# Patient Record
Sex: Male | Born: 1975 | Race: Asian | Hispanic: No | Marital: Married | Smoking: Former smoker
Health system: Southern US, Community
[De-identification: ages and names within clinical notes are randomized; demographics above are authoritative.]

## PROBLEM LIST (undated history)

## (undated) DIAGNOSIS — E782 Mixed hyperlipidemia: Secondary | ICD-10-CM

## (undated) DIAGNOSIS — M1A09X0 Idiopathic chronic gout, multiple sites, without tophus (tophi): Secondary | ICD-10-CM

## (undated) DIAGNOSIS — F17200 Nicotine dependence, unspecified, uncomplicated: Secondary | ICD-10-CM

## (undated) DIAGNOSIS — E781 Pure hyperglyceridemia: Secondary | ICD-10-CM

## (undated) DIAGNOSIS — R0789 Other chest pain: Secondary | ICD-10-CM

## (undated) DIAGNOSIS — N200 Calculus of kidney: Secondary | ICD-10-CM

## (undated) DIAGNOSIS — L309 Dermatitis, unspecified: Secondary | ICD-10-CM

## (undated) DIAGNOSIS — Q809 Congenital ichthyosis, unspecified: Secondary | ICD-10-CM

## (undated) DIAGNOSIS — G8929 Other chronic pain: Secondary | ICD-10-CM

## (undated) DIAGNOSIS — M722 Plantar fascial fibromatosis: Secondary | ICD-10-CM

## (undated) DIAGNOSIS — M25562 Pain in left knee: Secondary | ICD-10-CM

## (undated) HISTORY — DX: Calculus of kidney: N20.0

## (undated) HISTORY — DX: Mixed hyperlipidemia: E78.2

## (undated) HISTORY — DX: Dermatitis, unspecified: L30.9

## (undated) HISTORY — DX: Plantar fascial fibromatosis: M72.2

## (undated) HISTORY — DX: Nicotine dependence, unspecified, uncomplicated: F17.200

## (undated) HISTORY — DX: Congenital ichthyosis, unspecified: Q80.9

## (undated) HISTORY — DX: Idiopathic chronic gout, multiple sites, without tophus (tophi): M1A.09X0

## (undated) HISTORY — DX: Other chronic pain: G89.29

## (undated) HISTORY — DX: Other chest pain: R07.89

## (undated) HISTORY — DX: Pain in left knee: M25.562

## (undated) HISTORY — DX: Pure hyperglyceridemia: E78.1

---

## 2016-10-18 DIAGNOSIS — J301 Allergic rhinitis due to pollen: Secondary | ICD-10-CM | POA: Insufficient documentation

## 2016-10-18 LAB — HEPATIC FUNCTION PANEL
ALK PHOS: 79 (ref 25–125)
ALT: 31 (ref 10–40)
AST: 20 (ref 14–40)
Bilirubin, Total: 0.6

## 2016-10-18 LAB — LIPID PANEL
CHOLESTEROL: 223 — AB (ref 0–200)
HDL: 45 (ref 35–70)
LDL Cholesterol: 118
TRIGLYCERIDES: 302 — AB (ref 40–160)

## 2016-10-18 LAB — BASIC METABOLIC PANEL
BUN: 16 (ref 4–21)
Creatinine: 1 (ref 0.6–1.3)
Glucose: 102
POTASSIUM: 4.4 (ref 3.4–5.3)
Sodium: 142 (ref 137–147)

## 2016-10-18 LAB — CBC AND DIFFERENTIAL
HEMATOCRIT: 44 (ref 41–53)
HEMOGLOBIN: 15.4 (ref 13.5–17.5)
Platelets: 231 (ref 150–399)
WBC: 7.7

## 2017-11-14 ENCOUNTER — Ambulatory Visit: Payer: BC Managed Care – PPO | Admitting: Family Medicine

## 2017-11-14 ENCOUNTER — Encounter: Payer: Self-pay | Admitting: *Deleted

## 2017-11-14 ENCOUNTER — Other Ambulatory Visit: Payer: Self-pay

## 2017-11-14 ENCOUNTER — Telehealth: Payer: Self-pay | Admitting: *Deleted

## 2017-11-14 VITALS — BP 113/70 | HR 64 | Temp 98.3°F | Resp 16 | Ht 69.0 in | Wt 188.8 lb

## 2017-11-14 DIAGNOSIS — L2084 Intrinsic (allergic) eczema: Secondary | ICD-10-CM | POA: Diagnosis not present

## 2017-11-14 DIAGNOSIS — Q809 Congenital ichthyosis, unspecified: Secondary | ICD-10-CM

## 2017-11-14 MED ORDER — TRIAMCINOLONE ACETONIDE 0.5 % EX OINT: 1.0000 "application " | TOPICAL_OINTMENT | Freq: Two times a day (BID) | CUTANEOUS | 2 refills | Status: DC

## 2017-11-14 NOTE — Progress Notes (Signed)
Office Note 11/14/2017  CC:  Chief Complaint  Patient presents with  . Establish Care  . Eczema    HPI:  Gene Burke is a 41 y.o. Asian male who is here to establish care Patient's most recent primary MD: dr. Lendon ColonelHawks.  [facility]. Isidore Moosffice reviewed prior to or during today's visit. This consists of pt's last CPE, dated 10/13/15--it does not include any lab results (CBC, CMET, FLP and UA were ordered at that time).  Has eczematous dermatitis primarily on hands: sides and tops of fingers get tiny, itchy blisters.  In the past he has applied topical steroids and these were helpful, although it is unclear why he stopped applying this medication--possibly some loss of efficacy over time. Has been applying small patches from [place] to the areas.  He says the have a med soaked into them, sounds like a topical steroid.  Past Medical History:  Diagnosis Date  . 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 Chronic pain of left knee   . Dermatitis   . Hypertriglyceridemia   . Tobacco dependence     History reviewed. No pertinent surgical history.  Family History  Problem Relation Age of Onset  . Alzheimer's disease Father   . Parkinson's disease Father   . Heart attack Maternal Grandmother   . Lung disease Maternal Grandfather   . Stroke Paternal Grandfather [phone]  . Cancer Neg Hx   . Diabetes Neg Hx     Social History   Socioeconomic History  . Marital status: Married    Spouse name: Not on file  . Number of children: Not on file  . Years of education: Not on file  . Highest education level: Not on file  Social Needs  . Financial resource strain: Not on file  . Food insecurity - worry: Not on file  . Food insecurity - inability: Not on file  . Transportation needs - medical: Not on file  . Transportation needs - non-medical: Not on file  Occupational History  . Not on file  Tobacco Use  . Smoking status: Current Every Day  Smoker    Packs/day: 0.25    Years: 15.00    Pack years: 3.75    Types: Cigarettes  . Smokeless tobacco: Never Used  . Tobacco comment: has bought patches  Substance and Sexual Activity  . Alcohol use: No    Frequency: Never  . Drug use: No  . Sexual activity: Not on file  Other Topics Concern  . Not on file  Social History Narrative   Married, 1 son and 1 daughter.   Orig from [place].  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   Tob: 1 pack q 3 d: approx 10 pack-yr hx.     No alc.    Outpatient Encounter Medications as of 11/14/2017  Medication Sig  . fluticasone (FLONASE) 50 MCG/ACT nasal spray Place 1 spray into the nose daily.  Marland Kitchen. triamcinolone ointment (KENALOG) 0.5 % Apply 1 application topically 2 (two) times daily.   No facility-administered encounter medications on file as of 11/14/2017.     No Known Allergies  ROS Review of Systems  Constitutional: Negative for fatigue and fever.  HENT: Negative for congestion and sore throat.   Eyes: Negative for visual disturbance.  Respiratory: Negative for cough.   Cardiovascular: Negative for chest pain.  Gastrointestinal: Negative for abdominal pain and  nausea.  Genitourinary: Negative for dysuria.  Musculoskeletal: Negative for back pain and joint swelling.  Skin: Positive for rash (see hpi).  Neurological: Negative for weakness and headaches.  Hematological: Negative for adenopathy.    PE; Blood pressure 113/70, pulse 64, temperature 98.3 F (36.8 C), temperature source Oral, resp. rate 16, height 5\' 9"  (1.753 m), weight 188 lb 12 oz (85.6 kg), SpO2 95 %. Gen: Alert, well appearing.  Patient is oriented to person, place, time, and situation. AFFECT: pleasant, lucid thought and speech. NWG:NFAOENT:Eyes: no injection, icteris, swelling, or exudate.  EOMI, PERRLA. Mouth: lips without lesion/swelling.  Oral mucosa pink and moist. Oropharynx without erythema, exudate, or swelling.   CV: RRR, no m/r/g.   LUNGS: CTA bilat, nonlabored resps, good aeration in all lung fields. ABd: soft, NT/ND EXT: no clubbing, cyanosis, or edema.  SKIN: mild ichthyosis pattern to skin of both LL's.  Lateral aspect of left lower leg with a few small pinkish plaques/papules.  Similar lesions: small patches on fingers of R hand.  Pertinent labs:  None today  ASSESSMENT AND PLAN:   New pt: prior PCP record reviewed today.  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 Kenalog 0.5% generic ointment, apply bid to affected areas prn.  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  This is associated with atopy (see #1 above).  An After Visit Summary was printed and given to the patient.  Return for f/u at pt's conveneience for fasting CPE.  Signed:  Santiago BumpersPhil McGowen, MD           11/14/2017

## 2017-11-14 NOTE — Telephone Encounter (Signed)
[facility] Dr[facility] Lendon ColonelHawks.  I reviewed records and abstracted information into pts chart.   Records have been placed on Dr. Samul DadaMcGowen's desk for review.

## 2017-11-15 ENCOUNTER — Telehealth: Payer: Self-pay | Admitting: *Deleted

## 2017-11-15 DIAGNOSIS — Z0000 Encounter for general adult medical examination without abnormal findings: Secondary | ICD-10-CM

## 2017-11-15 DIAGNOSIS — M109 Gout, unspecified: Secondary | ICD-10-CM

## 2017-11-15 NOTE — Telephone Encounter (Signed)
Pt coming in 12/01/17 for labs for his CPE on 12/02/17.   HM labs ordered. Please let me know if any other labs are needed.

## 2017-11-15 NOTE — Telephone Encounter (Signed)
Also do a uric acid, dx code is gout-unspecified.-thx

## 2017-11-16 NOTE — Telephone Encounter (Signed)
HM labs and Uric acid lab order as directed.

## 2017-12-01 ENCOUNTER — Other Ambulatory Visit (INDEPENDENT_AMBULATORY_CARE_PROVIDER_SITE_OTHER): Payer: BC Managed Care – PPO

## 2017-12-01 DIAGNOSIS — Z0000 Encounter for general adult medical examination without abnormal findings: Secondary | ICD-10-CM

## 2017-12-01 DIAGNOSIS — M109 Gout, unspecified: Secondary | ICD-10-CM | POA: Diagnosis not present

## 2017-12-01 LAB — COMPREHENSIVE METABOLIC PANEL
ALK PHOS: 64 U/L (ref 39–117)
ALT: 53 U/L (ref 0–53)
AST: 35 U/L (ref 0–37)
Albumin: 4.6 g/dL (ref 3.5–5.2)
BUN: 15 mg/dL (ref 6–23)
CO2: 29 mEq/L (ref 19–32)
Calcium: 9.3 mg/dL (ref 8.4–10.5)
Chloride: 105 mEq/L (ref 96–112)
Creatinine, Ser: 0.91 mg/dL (ref 0.40–1.50)
GFR: 97.49 mL/min (ref >60.00)
GLUCOSE: 88 mg/dL (ref 70–99)
POTASSIUM: 4.3 meq/L (ref 3.5–5.1)
Sodium: 142 mEq/L (ref 135–145)
TOTAL PROTEIN: 7.3 g/dL (ref 6.0–8.3)
Total Bilirubin: 0.8 mg/dL (ref 0.2–1.2)

## 2017-12-01 LAB — CBC WITH DIFFERENTIAL/PLATELET
Basophils Absolute: 0.1 10*3/uL (ref 0.0–0.1)
Basophils Relative: 0.9 % (ref 0.0–3.0)
EOS PCT: 4.3 % (ref 0.0–5.0)
Eosinophils Absolute: 0.3 10*3/uL (ref 0.0–0.7)
HCT: 44.7 % (ref 39.0–52.0)
HEMOGLOBIN: 15.1 g/dL (ref 13.0–17.0)
LYMPHS ABS: 2.2 10*3/uL (ref 0.7–4.0)
Lymphocytes Relative: 36.8 % (ref 12.0–46.0)
MCHC: 33.8 g/dL (ref 30.0–36.0)
MCV: 91 fl (ref 78.0–100.0)
MONOS PCT: 8.9 % (ref 3.0–12.0)
Monocytes Absolute: 0.5 10*3/uL (ref 0.1–1.0)
Neutro Abs: 3 10*3/uL (ref 1.4–7.7)
Neutrophils Relative %: 49.1 % (ref 43.0–77.0)
Platelets: 196 10*3/uL (ref 150.0–400.0)
RBC: 4.91 Mil/uL (ref 4.22–5.81)
RDW: 12.7 % (ref 11.5–15.5)
WBC: 6 10*3/uL (ref 4.0–10.5)

## 2017-12-01 LAB — LIPID PANEL
Cholesterol: 208 mg/dL — ABNORMAL HIGH (ref 0–200)
HDL: 52.5 mg/dL (ref >39.00)
NONHDL: 155.17
TRIGLYCERIDES: 262 mg/dL — AB (ref 0.0–149.0)
Total CHOL/HDL Ratio: 4
VLDL: 52.4 mg/dL — AB (ref 0.0–40.0)

## 2017-12-01 LAB — URIC ACID: URIC ACID, SERUM: 8.2 mg/dL — AB (ref 4.0–7.8)

## 2017-12-01 LAB — LDL CHOLESTEROL, DIRECT: Direct LDL: 130 mg/dL

## 2017-12-01 LAB — TSH: TSH: 2.59 u[IU]/mL (ref 0.35–4.50)

## 2017-12-02 ENCOUNTER — Ambulatory Visit (INDEPENDENT_AMBULATORY_CARE_PROVIDER_SITE_OTHER): Payer: BC Managed Care – PPO | Admitting: Family Medicine

## 2017-12-02 ENCOUNTER — Other Ambulatory Visit: Payer: Self-pay

## 2017-12-02 ENCOUNTER — Encounter: Payer: Self-pay | Admitting: Family Medicine

## 2017-12-02 VITALS — BP 104/65 | HR 61 | Temp 97.5°F | Resp 16 | Ht 69.0 in | Wt 194.5 lb

## 2017-12-02 DIAGNOSIS — N481 Balanitis: Secondary | ICD-10-CM

## 2017-12-02 DIAGNOSIS — Z0001 Encounter for general adult medical examination with abnormal findings: Secondary | ICD-10-CM | POA: Diagnosis not present

## 2017-12-02 DIAGNOSIS — Z0000 Encounter for general adult medical examination without abnormal findings: Secondary | ICD-10-CM

## 2017-12-02 DIAGNOSIS — Z23 Encounter for immunization: Secondary | ICD-10-CM

## 2017-12-02 MED ORDER — KETOCONAZOLE 2 % EX CREA: 1.0000 "application " | TOPICAL_CREAM | Freq: Every day | CUTANEOUS | 1 refills | Status: DC

## 2017-12-02 NOTE — Patient Instructions (Signed)

## 2017-12-02 NOTE — Progress Notes (Signed)
Office Note 12/03/2017  CC:  Chief Complaint  Patient presents with  . Annual Exam    HPI:  Gene Burke is a 41 y.o. Asian male who is here for annual health maintenance exam.  Exercise: no regular exercise at this time. Diet: fairly healthy per his description.  Acute complaint: for about the last 1 mo he has felt intermittent burning-type discomfort on head of penis. He is uncircumcised.  Has no penile d/c, no hx of rash.  No dysuria.  No hx of STD.  Married, sexually active with wife only.    Past Medical History:  Diagnosis Date  . 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 Chronic pain of left knee   . Eczema    Hands primarily  . Hypertriglyceridemia   . 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  . Tobacco dependence     History reviewed. No pertinent surgical history.  Family History  Problem Relation Age of Onset  . Alzheimer's disease Father   . Parkinson's disease Father   . Heart attack Maternal Grandmother   . Lung disease Maternal Grandfather   . Stroke Paternal Grandfather [phone]  . Cancer Neg Hx   . Diabetes Neg Hx     Social History   Socioeconomic History  . Marital status: Married    Spouse name: Not on file  . Number of children: Not on file  . Years of education: Not on file  . Highest education level: Not on file  Social Needs  . Financial resource strain: Not on file  . Food insecurity - worry: Not on file  . Food insecurity - inability: Not on file  . Transportation needs - medical: Not on file  . Transportation needs - non-medical: Not on file  Occupational History  . Not on file  Tobacco Use  . Smoking status: Current Every Day Smoker    Packs/day: 0.25    Years: 15.00    Pack years: 3.75    Types: Cigarettes  . Smokeless tobacco: Never Used  . Tobacco comment: has bought patches  Substance and Sexual Activity  . Alcohol use: No    Frequency: Never  . Drug use: No  . Sexual  activity: Not on file  Other Topics Concern  . Not on file  Social History Narrative   Married, 1 son and 1 daughter.   Orig from [place].  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   Tob: 1 pack q 3 d: approx 10 pack-yr hx.     No alc.    Outpatient Medications Prior to Visit  Medication Sig Dispense Refill  . fluticasone (FLONASE) 50 MCG/ACT nasal spray Place 1 spray into the nose daily.    Marland Kitchen. triamcinolone ointment (KENALOG) 0.5 % Apply 1 application topically 2 (two) times daily. 15 g 2   No facility-administered medications prior to visit.     No Known Allergies  ROS Review of Systems  Constitutional: Negative for appetite change, chills, fatigue and fever.  HENT: Negative for congestion, dental problem, ear pain and sore throat.   Eyes: Negative for discharge, redness and visual disturbance.  Respiratory: Negative for cough, chest tightness, shortness of breath and wheezing.   Cardiovascular: Negative for chest pain, palpitations and leg swelling.  Gastrointestinal: Negative for abdominal pain, blood in stool, diarrhea, nausea and vomiting.  Genitourinary: Positive for penile pain (see hpi). Negative  for difficulty urinating, dysuria, flank pain, frequency, hematuria and urgency.  Musculoskeletal: Negative for arthralgias, back pain, joint swelling, myalgias and neck stiffness.  Skin: Negative for pallor and rash.  Neurological: Negative for dizziness, speech difficulty, weakness and headaches.  Hematological: Negative for adenopathy. Does not bruise/bleed easily.  Psychiatric/Behavioral: Negative for confusion and sleep disturbance. The patient is not nervous/anxious.     PE; Blood pressure 104/65, pulse 61, temperature (!) 97.5 F (36.4 C), temperature source Oral, resp. rate 16, height 5\' 9"  (1.753 m), weight 194 lb 8 oz (88.2 kg), SpO2 94 %. Gen: Alert, well appearing.  Patient is oriented to person, place, time, and  situation. AFFECT: pleasant, lucid thought and speech. ENT: Ears: EACs clear, normal epithelium.  TMs with good light reflex and landmarks bilaterally.  Eyes: no injection, icteris, swelling, or exudate.  EOMI, PERRLA. Nose: no drainage or turbinate edema/swelling.  No injection or focal lesion.  Mouth: lips without lesion/swelling.  Oral mucosa pink and moist.  Dentition intact and without obvious caries or gingival swelling.  Oropharynx without erythema, exudate, or swelling.  Neck: supple/nontender.  No LAD, mass, or TM.  Carotid pulses 2+ bilaterally, without bruits. CV: RRR, no m/r/g.   LUNGS: CTA bilat, nonlabored resps, good aeration in all lung fields. ABD: soft, NT, ND, BS normal.  No hepatospenomegaly or mass.  No bruits. EXT: no clubbing, cyanosis, or edema.  Musculoskeletal: no joint swelling, erythema, warmth, or tenderness.  ROM of all joints intact. Skin - no sores or suspicious lesions or rashes or color changes Genitals normal; both testes normal without tenderness, masses, hydroceles, varicoceles, erythema or swelling. Shaft normal, uncircumcised, meatus normal without discharge.  Glans with mild erythema on dorsal aspect.  No ulceration, no tenderness.  + Moisture over entire glans note when prepuce is retracted.  No inguinal hernia noted. No inguinal lymphadenopathy.   Pertinent labs:  Lab Results  Component Value Date   TSH 2.59 12/01/2017   Lab Results  Component Value Date   WBC 6.0 12/01/2017   HGB 15.1 12/01/2017   HCT 44.7 12/01/2017   MCV 91.0 12/01/2017   PLT 196.0 12/01/2017   Lab Results  Component Value Date   CREATININE 0.91 12/01/2017   BUN 15 12/01/2017   NA 142 12/01/2017   K 4.3 12/01/2017   CL 105 12/01/2017   CO2 29 12/01/2017   Lab Results  Component Value Date   ALT 53 12/01/2017   AST 35 12/01/2017   ALKPHOS 64 12/01/2017   BILITOT 0.8 12/01/2017   Lab Results  Component Value Date   CHOL 208 (H) 12/01/2017   Lab Results   Component Value Date   HDL 52.50 12/01/2017   Lab Results  Component Value Date   LDLCALC 118 10/18/2016   Lab Results  Component Value Date   TRIG 262.0 (H) 12/01/2017   Lab Results  Component Value Date   CHOLHDL 4 12/01/2017   Lab Results  Component Value Date   LABURIC 8.2 (H) 12/01/2017   ASSESSMENT AND PLAN:   1) Balanitis. Rx'd ketoconazole 2% cream to apply qd. Encouraged pt to try to keep area dry/clean.  2) Health maintenance exam: Reviewed age and gender appropriate health maintenance issues (prudent diet, regular exercise, health risks of tobacco and excessive alcohol, use of seatbelts, fire alarms in home, use of sunscreen).  Also reviewed age and gender appropriate health screening as well as vaccine recommendations. Vaccines: flu vaccine UTD.  Tdap given today. Labs: all fasting labs reviewed  in detail today--all normal except mild hypertriglyceridemia.  Discussed diet/exercise/wt loss.  An After Visit Summary was printed and given to the patient.  FOLLOW UP:  Return in about 1 year (around 12/02/2018) for annual CPE (fasting).  Signed:  Santiago Bumpers, MD           12/03/2017

## 2018-02-03 DIAGNOSIS — N200 Calculus of kidney: Secondary | ICD-10-CM | POA: Insufficient documentation

## 2018-04-07 DIAGNOSIS — E790 Hyperuricemia without signs of inflammatory arthritis and tophaceous disease: Secondary | ICD-10-CM | POA: Insufficient documentation

## 2018-04-07 DIAGNOSIS — R82991 Hypocitraturia: Secondary | ICD-10-CM | POA: Insufficient documentation

## 2018-12-04 ENCOUNTER — Encounter: Payer: BC Managed Care – PPO | Admitting: Family Medicine

## 2019-01-15 ENCOUNTER — Encounter: Payer: Self-pay | Admitting: Family Medicine

## 2019-01-15 ENCOUNTER — Telehealth: Payer: Self-pay | Admitting: *Deleted

## 2019-01-15 ENCOUNTER — Ambulatory Visit (INDEPENDENT_AMBULATORY_CARE_PROVIDER_SITE_OTHER): Payer: BC Managed Care – PPO | Admitting: Family Medicine

## 2019-01-15 VITALS — BP 101/62 | HR 87 | Temp 97.6°F | Resp 16 | Ht 69.0 in | Wt 206.1 lb

## 2019-01-15 DIAGNOSIS — E669 Obesity, unspecified: Secondary | ICD-10-CM

## 2019-01-15 DIAGNOSIS — Z0000 Encounter for general adult medical examination without abnormal findings: Secondary | ICD-10-CM | POA: Diagnosis not present

## 2019-01-15 DIAGNOSIS — L2084 Intrinsic (allergic) eczema: Secondary | ICD-10-CM | POA: Diagnosis not present

## 2019-01-15 MED ORDER — PIMECROLIMUS 1 % EX CREA: TOPICAL_CREAM | Freq: Two times a day (BID) | CUTANEOUS | 11 refills | Status: DC

## 2019-01-15 NOTE — Telephone Encounter (Signed)
PA sent via covermymed on 01/15/19   Key: [id number]   Medication: Elidel 1%   Dx: Eczema - L30.9   Per Dr. Milinda CaveMcGowen pt has tried and failed: N/A - we are trying to cut back on use of steroid creams    Waiting for response.

## 2019-01-15 NOTE — Progress Notes (Signed)
Office Note 01/15/2019  CC:  Chief Complaint  Patient presents with  . Annual Exam    Pt is not fasting.     HPI:  Gene Burke is a 43 y.o. male who is here for annual health maintenance exam.  C/o chronic nasal congestion due to smoking, flonase helps. Cutting back on smoking: down from 1 pack per day to 1 pack every 3 days. Has gained some wt, visited his mom and ate too much. No physical exercise at this time.  Still dealing with eczema on hands, patches of dry and flaky skin, occ turns pinkish, seems to flare more with certain kinds of food like seafood.  He is not good at remembering to apply steroid creams. He has several kinds/strengths of steroid creams at home.  Has some similar rash on anterior surface of L LL lately as well.  Son has eczema as well.    Past Medical History:  Diagnosis Date  . 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 Chronic pain of left knee   . Eczema    Hands primarily  . Hypertriglyceridemia   . 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  . Kidney stones    bladder stone had to be retrieved  . Tobacco dependence     History reviewed. No pertinent surgical history.  Family History  Problem Relation Age of Onset  . Alzheimer's disease Father   . Parkinson's disease Father   . Heart attack Maternal Grandmother   . Lung disease Maternal Grandfather   . Stroke Paternal Grandfather 2  . Cancer Neg Hx   . Diabetes Neg Hx     Social History   Socioeconomic History  . Marital status: Married    Spouse name: Not on file  . Number of children: Not on file  . Years of education: Not on file  . Highest education level: Not on file  Occupational History  . Not on file  Social Needs  . Financial resource strain: Not on file  . Food insecurity:    Worry: Not on file    Inability: Not on file  . Transportation needs:    Medical: Not on file    Non-medical: Not on file  Tobacco Use  .  Smoking status: Current Every Day Smoker    Packs/day: 0.25    Years: 15.00    Pack years: 3.75    Types: Cigarettes  . Smokeless tobacco: Never Used  . Tobacco comment: has bought patches  Substance and Sexual Activity  . Alcohol use: No    Frequency: Never  . Drug use: No  . Sexual activity: Not on file  Lifestyle  . Physical activity:    Days per week: Not on file    Minutes per session: Not on file  . Stress: Not on file  Relationships  . Social connections:    Talks on phone: Not on file    Gets together: Not on file    Attends religious service: Not on file    Active member of club or organization: Not on file    Attends meetings of clubs or organizations: Not on file    Relationship status: Not on file  . Intimate partner violence:    Fear of current or ex partner: Not on file    Emotionally abused: Not on file    Physically abused: Not on file    Forced sexual activity: Not on file  Other Topics  Concern  . Not on file  Social History Narrative   Married, 1 son and 1 daughter.   Orig from [place].  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   Tob: 1 pack q 3 d: approx 10 pack-yr hx.     No alc.    Outpatient Medications Prior to Visit  Medication Sig Dispense Refill  . fluticasone (FLONASE) 50 MCG/ACT nasal spray Place 1 spray into the nose daily.    Marland Kitchen. triamcinolone ointment (KENALOG) 0.5 % Apply 1 application topically 2 (two) times daily. 15 g 2  . ketoconazole (NIZORAL) 2 % cream Apply 1 application topically daily. 15 g 1   No facility-administered medications prior to visit.     No Known Allergies  ROS Review of Systems  Constitutional: Negative for appetite change, chills, fatigue and fever.  HENT: Negative for congestion, dental problem, ear pain and sore throat.   Eyes: Negative for discharge, redness and visual disturbance.  Respiratory: Negative for cough, chest tightness, shortness of breath and  wheezing.   Cardiovascular: Negative for chest pain, palpitations and leg swelling.  Gastrointestinal: Negative for abdominal pain, blood in stool, diarrhea, nausea and vomiting.  Genitourinary: Negative for difficulty urinating, dysuria, flank pain, frequency, hematuria and urgency.  Musculoskeletal: Negative for arthralgias, back pain, joint swelling, myalgias and neck stiffness.  Skin: Positive for rash (see hpi). Negative for pallor.  Neurological: Negative for dizziness, speech difficulty, weakness and headaches.  Hematological: Negative for adenopathy. Does not bruise/bleed easily.  Psychiatric/Behavioral: Negative for confusion and sleep disturbance. The patient is not nervous/anxious.     PE; Blood pressure 101/62, pulse 87, temperature 97.6 F (36.4 C), temperature source Oral, resp. rate 16, height 5\' 9"  (1.753 m), weight 206 lb 2 oz (93.5 kg), SpO2 94 %. Body mass index is 30.44 kg/m.  Gen: Alert, well appearing.  Patient is oriented to person, place, time, and situation. AFFECT: pleasant, lucid thought and speech. ENT: Ears: EACs clear, normal epithelium.  TMs with good light reflex and landmarks bilaterally.  Eyes: no injection, icteris, swelling, or exudate.  EOMI, PERRLA. Nose: no drainage or turbinate edema/swelling.  No injection or focal lesion.  Mouth: lips without lesion/swelling.  Oral mucosa pink and moist.  Dentition intact and without obvious caries or gingival swelling.  Oropharynx without erythema, exudate, or swelling.  Neck: supple/nontender.  No LAD, mass, or TM.  Carotid pulses 2+ bilaterally, without bruits. CV: RRR, no m/r/g.   LUNGS: CTA bilat, nonlabored resps, good aeration in all lung fields. ABD: soft, NT, ND, BS normal.  No hepatospenomegaly or mass.  No bruits. EXT: no clubbing, cyanosis, or edema.  Musculoskeletal: no joint swelling, erythema, warmth, or tenderness.  ROM of all joints intact. Skin - no sores or suspicious lesions, color changes. He  has splotches of mildly pinkish dry and flaky skin on all fingers and on L leg pretibial region. No vesicles or pustules.  No erythema or streaking.  No tenderness.   Pertinent labs:  Lab Results  Component Value Date   TSH 2.59 12/01/2017   Lab Results  Component Value Date   WBC 6.0 12/01/2017   HGB 15.1 12/01/2017   HCT 44.7 12/01/2017   MCV 91.0 12/01/2017   PLT 196.0 12/01/2017   Lab Results  Component Value Date   CREATININE 0.91 12/01/2017   BUN 15 12/01/2017   NA 142 12/01/2017   K 4.3 12/01/2017   CL 105  12/01/2017   CO2 29 12/01/2017   Lab Results  Component Value Date   ALT 53 12/01/2017   AST 35 12/01/2017   ALKPHOS 64 12/01/2017   BILITOT 0.8 12/01/2017   Lab Results  Component Value Date   CHOL 208 (H) 12/01/2017   Lab Results  Component Value Date   HDL 52.50 12/01/2017   Lab Results  Component Value Date   LDLCALC 118 10/18/2016   Lab Results  Component Value Date   TRIG 262.0 (H) 12/01/2017   Lab Results  Component Value Date   CHOLHDL 4 12/01/2017   Lab Results  Component Value Date   LABURIC 8.2 (H) 12/01/2017    ASSESSMENT AND PLAN:   1) Eczema: decided on trial of elidel cream bid to allow for some steroid-sparing. He needs to get on a regular routine of applying medicated creams and avoiding foods that trigger worsening. Also, discussed use of rubber gloves on hands through the night after applying medication to hands.  2) Health maintenance exam: Reviewed age and gender appropriate health maintenance issues (prudent diet, regular exercise, health risks of tobacco and excessive alcohol, use of seatbelts, fire alarms in home, use of sunscreen).  Also reviewed age and gender appropriate health screening as well as vaccine recommendations. Vaccines: Tdap and flu vaccine are UTD.   Labs: return for fasting health panel.   Prostate ca screening: average risk patient= as per latest guidelines, start screening at 45-50 yrs of  age. Colon ca screening: average risk patient= as per latest guidelines, start screening at 45-50 yrs of age.   An After Visit Summary was printed and given to the patient.  FOLLOW UP:  Return in about 1 year (around 01/16/2020) for annual CPE (fasting).  Signed:  Santiago Bumpers, MD           01/15/2019

## 2019-01-15 NOTE — Patient Instructions (Signed)

## 2019-01-16 NOTE — Telephone Encounter (Signed)
PA approved 01/15/19   Ref#: None   Approved 01/15/19 to 01/15/22

## 2019-01-17 ENCOUNTER — Other Ambulatory Visit (INDEPENDENT_AMBULATORY_CARE_PROVIDER_SITE_OTHER): Payer: BC Managed Care – PPO

## 2019-01-17 DIAGNOSIS — Z0000 Encounter for general adult medical examination without abnormal findings: Secondary | ICD-10-CM

## 2019-01-17 LAB — CBC WITH DIFFERENTIAL/PLATELET
Basophils Absolute: 0.1 10*3/uL (ref 0.0–0.1)
Basophils Relative: 0.8 % (ref 0.0–3.0)
Eosinophils Absolute: 0.2 10*3/uL (ref 0.0–0.7)
Eosinophils Relative: 3.3 % (ref 0.0–5.0)
HCT: 47.7 % (ref 39.0–52.0)
Hemoglobin: 16.3 g/dL (ref 13.0–17.0)
Lymphocytes Relative: 38.6 % (ref 12.0–46.0)
Lymphs Abs: 2.8 10*3/uL (ref 0.7–4.0)
MCHC: 34.1 g/dL (ref 30.0–36.0)
MCV: 89.1 fl (ref 78.0–100.0)
Monocytes Absolute: 0.6 10*3/uL (ref 0.1–1.0)
Monocytes Relative: 7.8 % (ref 3.0–12.0)
Neutro Abs: 3.6 10*3/uL (ref 1.4–7.7)
Neutrophils Relative %: 49.5 % (ref 43.0–77.0)
Platelets: 216 10*3/uL (ref 150.0–400.0)
RBC: 5.36 Mil/uL (ref 4.22–5.81)
RDW: 12.1 % (ref 11.5–15.5)
WBC: 7.2 10*3/uL (ref 4.0–10.5)

## 2019-01-18 ENCOUNTER — Encounter: Payer: Self-pay | Admitting: Family Medicine

## 2019-01-18 LAB — COMPREHENSIVE METABOLIC PANEL
ALK PHOS: 68 U/L (ref 39–117)
ALT: 63 U/L — ABNORMAL HIGH (ref 0–53)
AST: 32 U/L (ref 0–37)
Albumin: 4.9 g/dL (ref 3.5–5.2)
BUN: 16 mg/dL (ref 6–23)
CO2: 26 mEq/L (ref 19–32)
Calcium: 9.9 mg/dL (ref 8.4–10.5)
Chloride: 103 mEq/L (ref 96–112)
Creatinine, Ser: 0.86 mg/dL (ref 0.40–1.50)
GFR: 97.37 mL/min (ref >60.00)
GLUCOSE: 95 mg/dL (ref 70–99)
Potassium: 4.1 mEq/L (ref 3.5–5.1)
Sodium: 139 mEq/L (ref 135–145)
TOTAL PROTEIN: 7.2 g/dL (ref 6.0–8.3)
Total Bilirubin: 0.9 mg/dL (ref 0.2–1.2)

## 2019-01-18 LAB — LIPID PANEL
Cholesterol: 225 mg/dL — ABNORMAL HIGH (ref 0–200)
HDL: 44.2 mg/dL (ref >39.00)
NonHDL: 180.69
Total CHOL/HDL Ratio: 5
Triglycerides: 282 mg/dL — ABNORMAL HIGH (ref 0.0–149.0)
VLDL: 56.4 mg/dL — ABNORMAL HIGH (ref 0.0–40.0)

## 2019-01-18 LAB — TSH: TSH: 2.46 u[IU]/mL (ref 0.35–4.50)

## 2019-01-18 LAB — LDL CHOLESTEROL, DIRECT: LDL DIRECT: 139 mg/dL

## 2019-01-30 ENCOUNTER — Encounter: Payer: Self-pay | Admitting: Family Medicine

## 2019-02-06 ENCOUNTER — Encounter: Payer: Self-pay | Admitting: Family Medicine

## 2019-02-06 ENCOUNTER — Ambulatory Visit: Payer: BC Managed Care – PPO | Admitting: Family Medicine

## 2019-02-06 VITALS — BP 138/70 | HR 61 | Temp 98.2°F | Resp 16 | Ht 69.0 in | Wt 207.4 lb

## 2019-02-06 DIAGNOSIS — M79672 Pain in left foot: Secondary | ICD-10-CM | POA: Diagnosis not present

## 2019-02-06 DIAGNOSIS — G8929 Other chronic pain: Secondary | ICD-10-CM | POA: Diagnosis not present

## 2019-02-06 DIAGNOSIS — M722 Plantar fascial fibromatosis: Secondary | ICD-10-CM | POA: Diagnosis not present

## 2019-02-06 MED ORDER — MELOXICAM 15 MG PO TABS: ORAL_TABLET | ORAL | 1 refills | Status: DC

## 2019-02-06 NOTE — Patient Instructions (Signed)
Ice your heel for 30 min one to two times per day as needed. Take meloxicam (prescribed today) as needed for pain in foot. Do foot stretching exercises.

## 2019-02-06 NOTE — Progress Notes (Signed)
OFFICE VISIT  02/08/2019   CC:  Chief Complaint  Patient presents with  . Foot Pain    left   HPI:    Patient is a 43 y.o. Asian male who presents for left foot pain on bottom of heel. Pain in this area on and off for the last 3 yrs. Usually comes on more after lots of wt bearing activity. Takes 2-3 days for pain to go away.  Currently says it is in the improving phase at this time. No preceding injury or time of excessive wt bearing activity.  No meds tried for symptom. No ice.  He says he has very good,comfortable shoes.    Past Medical History:  Diagnosis Date  . 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 Chronic pain of left knee   . Eczema    Hands primarily  . Hypertriglyceridemia    TLC  . 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  . Kidney stones    bladder stone had to be retrieved (unable to get old records-->pt w/out clear idea where he got this done... "in [facility]"..[facility] u[facility] says not their pt ever).  . Tobacco dependence     History reviewed. No pertinent surgical history.  Outpatient Medications Prior to Visit  Medication Sig Dispense Refill  . fluticasone (FLONASE) 50 MCG/ACT nasal spray Place 1 spray into the nose daily.    . pimecrolimus (ELIDEL) 1 % cream Apply topically 2 (two) times daily. 60 g 11  . triamcinolone ointment (KENALOG) 0.5 % Apply 1 application topically 2 (two) times daily. 15 g 2   No facility-administered medications prior to visit.     No Known Allergies  ROS As per HPI  PE: Blood pressure 138/70, pulse 61, temperature 98.2 F (36.8 C), temperature source Oral, resp. rate 16, height 5\' 9"  (1.753 m), weight 207 lb 6 oz (94.1 kg), SpO2 95 %. Gen: Alert, well appearing.  Patient is oriented to person, place, time, and situation. Left ankle and foot with normal ROM, without erythema or swelling.  Left heel with TTP at distal aspect of calcaneous extending a bit up the plantar fascia.  No  TTP of distal metatarsals/metatarsal heads. Arches normal.  LABS:  Lab Results  Component Value Date   LABURIC 8.2 (H) 12/01/2017    Lab Results  Component Value Date   TSH 2.46 01/17/2019   Lab Results  Component Value Date   WBC 7.2 01/17/2019   HGB 16.3 01/17/2019   HCT 47.7 01/17/2019   MCV 89.1 01/17/2019   PLT 216.0 01/17/2019   Lab Results  Component Value Date   CREATININE 0.86 01/17/2019   BUN 16 01/17/2019   NA 139 01/17/2019   K 4.1 01/17/2019   CL 103 01/17/2019   CO2 26 01/17/2019   Lab Results  Component Value Date   ALT 63 (H) 01/17/2019   AST 32 01/17/2019   ALKPHOS 68 01/17/2019   BILITOT 0.9 01/17/2019   Lab Results  Component Value Date   CHOL 225 (H) 01/17/2019   Lab Results  Component Value Date   HDL 44.20 01/17/2019   Lab Results  Component Value Date   LDLCALC 118 10/18/2016   Lab Results  Component Value Date   TRIG 282.0 (H) 01/17/2019   Lab Results  Component Value Date   CHOLHDL 5 01/17/2019    IMPRESSION AND PLAN:  Left plantar fasciitis: discussed conservative measures-->meloxicam 15mg  qd prn, icing regimen, stretching  regimen discussed/handout given.  Pt did not want a steroid injection today. I told him that this would be a future option and if he did want this shot I would refer him to podiatry or sports medicine.  An After Visit Summary was printed and given to the patient.  FOLLOW UP: Return if symptoms worsen or fail to improve.  Signed:  Santiago Bumpers, MD           02/08/2019

## 2019-04-23 ENCOUNTER — Other Ambulatory Visit: Payer: Self-pay | Admitting: Family Medicine

## 2019-04-23 NOTE — Telephone Encounter (Signed)
RF request for Meloxicam  LOV: 02/06/2019 Next ov: Not scheduled  Last written: 02/06/2019  Pt was called and message was left to ask if patient was still taking and needing this medication. Was being used for acute foot pain. Pt was advised in message that we may need to do a virtual visit again if still hurting or place referral (see last OV note).

## 2019-06-01 ENCOUNTER — Telehealth: Payer: Self-pay

## 2019-06-01 DIAGNOSIS — M79673 Pain in unspecified foot: Secondary | ICD-10-CM

## 2019-06-01 NOTE — Telephone Encounter (Signed)
Last OV 02/06/19, PCP mentioned future option would be referral to podiatry or sports medicine. Please advise, thanks.   Copied from [place] [id number]. Topic: Referral - Request for Referral >> Jun 01, 2019  2:30 PM Lennox Solders wrote: Has patient seen PCP for this complaint? Yes. pt saw dr Anitra Lauth 02-06-2019 for left foot plantar fasciitis. Pt would like a referral to foot doctor. Pt has bcbs

## 2019-06-02 NOTE — Telephone Encounter (Signed)
Podiatry referral ordered 

## 2019-06-02 NOTE — Addendum Note (Signed)
Addended by: Tammi Sou on: 06/02/2019 09:21 AM   Modules accepted: Orders

## 2019-06-12 ENCOUNTER — Ambulatory Visit (INDEPENDENT_AMBULATORY_CARE_PROVIDER_SITE_OTHER): Payer: BC Managed Care – PPO

## 2019-06-12 ENCOUNTER — Other Ambulatory Visit: Payer: Self-pay | Admitting: Podiatry

## 2019-06-12 ENCOUNTER — Other Ambulatory Visit: Payer: Self-pay

## 2019-06-12 ENCOUNTER — Ambulatory Visit: Payer: BC Managed Care – PPO | Admitting: Podiatry

## 2019-06-12 ENCOUNTER — Encounter: Payer: Self-pay | Admitting: Podiatry

## 2019-06-12 VITALS — BP 130/85 | Temp 97.9°F

## 2019-06-12 DIAGNOSIS — M79672 Pain in left foot: Secondary | ICD-10-CM

## 2019-06-12 DIAGNOSIS — M722 Plantar fascial fibromatosis: Secondary | ICD-10-CM

## 2019-06-12 DIAGNOSIS — M79671 Pain in right foot: Secondary | ICD-10-CM

## 2019-06-12 MED ORDER — METHYLPREDNISOLONE 4 MG PO TBPK: ORAL_TABLET | ORAL | 0 refills | Status: DC

## 2019-06-12 NOTE — Patient Instructions (Signed)

## 2019-06-19 NOTE — Progress Notes (Signed)
Subjective:   Patient ID: Gene Burke, male   DOB: 43 y.o.   MRN: [id number]   HPI 43 year old male presents the office today for concerns of bilateral heel pain which is been under the last 6 months.  He states he tried meloxicam for 3 days which is helpful.  He gets pain to bilateral hallux in the morning when he first gets up after being on his feet all day.   No recent injury.  He states he plays a lot of asphalt has started after he wore a pair of tight shoes.  No radiating pain.  No weakness.  No falls.   Review of Systems  All other systems reviewed and are negative.  Past Medical History:  Diagnosis Date  . 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 Chronic pain of left knee   . Eczema    Hands primarily  . Hypertriglyceridemia    TLC  . 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  . Kidney stones    bladder stone had to be retrieved (unable to get old records-->pt w/out clear idea where he got this done... "in [facility] says not their pt ever).  . Tobacco dependence     No past surgical history on file.   Current Outpatient Medications:  .  fluticasone (FLONASE) 50 MCG/ACT nasal spray, Place 1 spray into the nose daily., Disp: , Rfl:  .  meloxicam (MOBIC) 15 MG tablet, 1 tab po qd prn foot pain, Disp: 30 tablet, Rfl: 1 .  pimecrolimus (ELIDEL) 1 % cream, Apply topically 2 (two) times daily., Disp: 60 g, Rfl: 11 .  triamcinolone ointment (KENALOG) 0.5 %, Apply 1 application topically 2 (two) times daily., Disp: 15 g, Rfl: 2 .  methylPREDNISolone (MEDROL DOSEPAK) 4 MG TBPK tablet, Take as directed, Disp: 21 tablet, Rfl: 0  No Known Allergies        Objective:  Physical Exam  General: AAO x3, NAD  Dermatological: Skin is warm, dry and supple bilateral. Nails x 10 are well manicured; remaining integument appears unremarkable at this time. There are no open sores, no preulcerative lesions, no rash or signs of  infection present.  Vascular: Dorsalis Pedis artery and Posterior Tibial artery pedal pulses are 2/4 bilateral with immedate capillary fill time. Pedal hair growth present. No varicosities and no lower extremity edema present bilateral. There is no pain with calf compression, swelling, warmth, erythema.   Neruologic: Grossly intact via light touch bilateral. Vibratory intact via tuning fork bilateral. Protective threshold with Semmes Wienstein monofilament intact to all pedal sites bilateral.  Negative Tinel sign.  Musculoskeletal: Tenderness to palpation along the plantar medial tubercle of the calcaneus at the insertion of plantar fascia on the left and right foot. There is no pain along the course of the plantar fascia within the arch of the foot. Plantar fascia appears to be intact. There is no pain with lateral compression of the calcaneus or pain with vibratory sensation. There is no pain along the course or insertion of the achilles tendon. No other areas of tenderness to bilateral lower extremities.Muscular strength 5/5 in all groups tested bilateral.  Gait: Unassisted, Nonantalgic.       Assessment:   Bilateral plantar fasciitis     Plan:  -Treatment options discussed including all alternatives, risks, and complications -Etiology of symptoms were discussed -X-rays were obtained and reviewed with the patient.  There is no evidence of acute fracture or stress  fracture. -Bilateral steroid injections performed.  See procedure note below. -He went to hold off on a plantar fascial brace today.  Did not order one morning. -Medrol Dosepak prescribed. -Discussed stretching, icing daily -Discussed shoe modifications, orthotics  Procedure: Injection Tendon/Ligament Discussed alternatives, risks, complications and verbal consent was obtained.  Location:Bilateral plantar fascia at the glabrous junction; medial approach. Skin Prep: Alcohol  Injectate: 0.5cc 0.5% marcaine plain, 0.5 cc 2%  lidocaine plain and, 1 cc kenalog 10. Disposition: Patient tolerated procedure well. Injection site dressed with a band-aid.  Post-injection care was discussed and return precautions discussed.   Trula Slade DPM

## 2019-07-10 ENCOUNTER — Ambulatory Visit: Payer: BC Managed Care – PPO | Admitting: Podiatry

## 2019-07-10 ENCOUNTER — Other Ambulatory Visit: Payer: Self-pay

## 2019-07-10 VITALS — Temp 97.6°F

## 2019-07-10 DIAGNOSIS — M722 Plantar fascial fibromatosis: Secondary | ICD-10-CM | POA: Diagnosis not present

## 2019-07-10 NOTE — Patient Instructions (Signed)

## 2019-07-11 NOTE — Progress Notes (Signed)
Subjective: 43 year old male presents the office for follow-up evaluation of left greater than right heel pain.  He states that the injection did help quite a bit.  He states that his pain now about 4/10 worse before it was 10/10.  He can walk better.  He did discontinue the Medrol Dosepak because he states he was causing his penis to hurt.  He has not been to the stretching icing.  He has no other concerns today. Denies any systemic complaints such as fevers, chills, nausea, vomiting. No acute changes since last appointment, and no other complaints at this time.   Objective: AAO x3, NAD DP/PT pulses palpable bilaterally, CRT less than 3 seconds There is still tenderness palpation most on the left plantar medial tubercle of the calcaneus at the insertion of plantar fascia on the left side.  Plantar fascial appears intact.  Minimal discomfort on the right side.  No pain with lateral compression of calcaneus.  No edema, erythema. No open lesions or pre-ulcerative lesions.  No pain with calf compression, swelling, warmth, erythema  Assessment: Left foot plantar fasciitis greater than right  Plan: -All treatment options discussed with the patient including all alternatives, risks, complications.  -Steroid injection performed to the left foot today.  See procedure note.  I encourage stretching, icing exercises daily.  Discussed shoe modifications and orthotics as well. -Patient encouraged to call the office with any questions, concerns, change in symptoms.   Procedure: Injection Tendon/Ligament Discussed alternatives, risks, complications and verbal consent was obtained.  Location: LEFT  plantar fascia at the glabrous junction; medial approach. Skin Prep: Alcohol  Injectate: 0.5cc 0.5% marcaine plain, 0.5 cc 2% lidocaine plain and, 1 cc kenalog 10. Disposition: Patient tolerated procedure well. Injection site dressed with a band-aid.  Post-injection care was discussed and return precautions  discussed.   Trula Slade DPM

## 2019-08-06 ENCOUNTER — Ambulatory Visit: Payer: BC Managed Care – PPO | Admitting: Podiatry

## 2019-08-13 ENCOUNTER — Ambulatory Visit: Payer: BC Managed Care – PPO | Admitting: Podiatry

## 2019-08-15 ENCOUNTER — Encounter: Payer: Self-pay | Admitting: Family Medicine

## 2019-10-12 ENCOUNTER — Telehealth: Payer: Self-pay | Admitting: Family Medicine

## 2019-10-12 NOTE — Telephone Encounter (Signed)
Patient called wanted to know if he could get a referral to dermatology. He states that he thinks he might have eczema on his hands. He also would like to have a referral to a cardiologist. He says that he feels pain in his chest and where his heart is. Patient says that he does not have any SOB or tingling feeling down arm. He says this pain is only occasionally.

## 2019-10-12 NOTE — Telephone Encounter (Signed)
LMOM for pt to CB to discuss chest pain.     Due to lack of appointment availability, okay to place referrals?   Please advise.

## 2019-12-21 DIAGNOSIS — K760 Fatty (change of) liver, not elsewhere classified: Secondary | ICD-10-CM

## 2019-12-21 DIAGNOSIS — L409 Psoriasis, unspecified: Secondary | ICD-10-CM

## 2019-12-21 HISTORY — DX: Fatty (change of) liver, not elsewhere classified: K76.0

## 2019-12-21 HISTORY — DX: Psoriasis, unspecified: L40.9

## 2020-01-17 ENCOUNTER — Ambulatory Visit (INDEPENDENT_AMBULATORY_CARE_PROVIDER_SITE_OTHER): Payer: BC Managed Care – PPO | Admitting: Family Medicine

## 2020-01-17 ENCOUNTER — Other Ambulatory Visit: Payer: Self-pay

## 2020-01-17 ENCOUNTER — Encounter: Payer: Self-pay | Admitting: Family Medicine

## 2020-01-17 VITALS — BP 113/80 | HR 78 | Temp 98.5°F | Resp 16 | Ht 69.0 in | Wt 200.6 lb

## 2020-01-17 DIAGNOSIS — R0789 Other chest pain: Secondary | ICD-10-CM

## 2020-01-17 DIAGNOSIS — Z0000 Encounter for general adult medical examination without abnormal findings: Secondary | ICD-10-CM | POA: Diagnosis not present

## 2020-01-17 DIAGNOSIS — F17200 Nicotine dependence, unspecified, uncomplicated: Secondary | ICD-10-CM

## 2020-01-17 DIAGNOSIS — E663 Overweight: Secondary | ICD-10-CM | POA: Insufficient documentation

## 2020-01-17 DIAGNOSIS — L309 Dermatitis, unspecified: Secondary | ICD-10-CM

## 2020-01-17 DIAGNOSIS — E781 Pure hyperglyceridemia: Secondary | ICD-10-CM

## 2020-01-17 DIAGNOSIS — M109 Gout, unspecified: Secondary | ICD-10-CM

## 2020-01-17 LAB — COMPREHENSIVE METABOLIC PANEL
ALT: 42 U/L (ref 0–53)
AST: 24 U/L (ref 0–37)
Albumin: 4.4 g/dL (ref 3.5–5.2)
Alkaline Phosphatase: 74 U/L (ref 39–117)
BUN: 12 mg/dL (ref 6–23)
CO2: 26 mEq/L (ref 19–32)
Calcium: 9.4 mg/dL (ref 8.4–10.5)
Chloride: 107 mEq/L (ref 96–112)
Creatinine, Ser: 0.85 mg/dL (ref 0.40–1.50)
GFR: 98.23 mL/min (ref >60.00)
Glucose, Bld: 95 mg/dL (ref 70–99)
Potassium: 4.3 mEq/L (ref 3.5–5.1)
Sodium: 139 mEq/L (ref 135–145)
Total Bilirubin: 0.8 mg/dL (ref 0.2–1.2)
Total Protein: 6.8 g/dL (ref 6.0–8.3)

## 2020-01-17 LAB — CBC WITH DIFFERENTIAL/PLATELET
Basophils Absolute: 0.1 10*3/uL (ref 0.0–0.1)
Basophils Relative: 0.8 % (ref 0.0–3.0)
Eosinophils Absolute: 0.2 10*3/uL (ref 0.0–0.7)
Eosinophils Relative: 2.8 % (ref 0.0–5.0)
HCT: 45.5 % (ref 39.0–52.0)
Hemoglobin: 15.7 g/dL (ref 13.0–17.0)
Lymphocytes Relative: 33.7 % (ref 12.0–46.0)
Lymphs Abs: 2.3 10*3/uL (ref 0.7–4.0)
MCHC: 34.5 g/dL (ref 30.0–36.0)
MCV: 89.3 fl (ref 78.0–100.0)
Monocytes Absolute: 0.6 10*3/uL (ref 0.1–1.0)
Monocytes Relative: 8.7 % (ref 3.0–12.0)
Neutro Abs: 3.7 10*3/uL (ref 1.4–7.7)
Neutrophils Relative %: 54 % (ref 43.0–77.0)
Platelets: 198 10*3/uL (ref 150.0–400.0)
RBC: 5.1 Mil/uL (ref 4.22–5.81)
RDW: 12.3 % (ref 11.5–15.5)
WBC: 6.9 10*3/uL (ref 4.0–10.5)

## 2020-01-17 LAB — LIPID PANEL
Cholesterol: 220 mg/dL — ABNORMAL HIGH (ref 0–200)
HDL: 47.1 mg/dL (ref >39.00)
NonHDL: 172.79
Total CHOL/HDL Ratio: 5
Triglycerides: 308 mg/dL — ABNORMAL HIGH (ref 0.0–149.0)
VLDL: 61.6 mg/dL — ABNORMAL HIGH (ref 0.0–40.0)

## 2020-01-17 LAB — URIC ACID: Uric Acid, Serum: 7.4 mg/dL (ref 4.0–7.8)

## 2020-01-17 LAB — TSH: TSH: 2.32 u[IU]/mL (ref 0.35–4.50)

## 2020-01-17 LAB — LDL CHOLESTEROL, DIRECT: Direct LDL: 114 mg/dL

## 2020-01-17 NOTE — Patient Instructions (Signed)

## 2020-01-17 NOTE — Progress Notes (Signed)
Office Note 01/17/2020  CC:  Chief Complaint  Patient presents with  . Annual Exam    pt is fasting    HPI:  Gene Burke is a 44 y.o. Asian male who is here for annual health maintenance exam.  Exercise: walking daily Diet: "healthier than last year".  Podiatrist: got 2 shots for plantar fasciitis helped minimally but after he hiked 6 miles it went away completely.  He's down to 3 cigs per week. Has some itchy eczematous areas on hands.  No help anymore with the topical steroids I rx'd in the past, also no help from elidel.  Currently using a patch from his home country but he doesn't know what is in it.  He requests referral to cardiologist: had one episode of L sided CP about a year ago playing an intense/long game of soccer.  No radiation of pain, no chest pressure, no palpitations, no dizziness, no diaphoresis, no unusual SOB.  He has since had some recurrent episodes->not necessarily to do with any activity of milder pain that "feels like it's from my heart". He states this was a more prominent problem when he was smoking more.  States he had some testing back in his home country in remote past and was told "something wasn't flowing right" and he was told to stop smoking and work on better diet and exercise. He requests cardiology referral for further risk stratification.  Past Medical History:  Diagnosis Date  . 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 Chronic pain of left knee   . Eczema    Hands primarily  . Hypertriglyceridemia    TLC  . 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  . Kidney stones    bladder stone had to be retrieved (unable to get old records-->pt w/out clear idea where he got this done... "in [facility]"..[facility] u[facility] says not their pt ever).  . Plantar fasciitis, bilateral    steroid inj by podiatry 2020 helpful  . Tobacco dependence     History reviewed. No pertinent surgical history.  Family History   Problem Relation Age of Onset  . Alzheimer's disease Father   . Parkinson's disease Father   . Heart attack Maternal Grandmother   . Lung disease Maternal Grandfather   . Stroke Paternal Grandfather 18  . Cancer Neg Hx   . Diabetes Neg Hx     Social History   Socioeconomic History  . Marital status: Married    Spouse name: Not on file  . Number of children: Not on file  . Years of education: Not on file  . Highest education level: Not on file  Occupational History  . Not on file  Tobacco Use  . Smoking status: Current Every Day Smoker    Packs/day: 0.25    Years: 15.00    Pack years: 3.75    Types: Cigarettes  . Smokeless tobacco: Never Used  . Tobacco comment: has bought patches  Substance and Sexual Activity  . Alcohol use: No  . Drug use: No  . Sexual activity: Not on file  Other Topics Concern  . Not on file  Social History Narrative   Married, 1 son and 1 daughter.   Orig from [place].  Relocated to [place] 2000, got Ph.D in finance at [employer].     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   Tob: 1 pack q 3 d: approx 10 pack-yr hx.     No alc.  Social Determinants of Health   Financial Resource Strain:   . Difficulty of Paying Living Expenses: Not on file  Food Insecurity:   . Worried About Programme researcher, broadcasting/film/video in the Last Year: Not on file  . Ran Out of Food in the Last Year: Not on file  Transportation Needs:   . Lack of Transportation (Medical): Not on file  . Lack of Transportation (Non-Medical): Not on file  Physical Activity:   . Days of Exercise per Week: Not on file  . Minutes of Exercise per Session: Not on file  Stress:   . Feeling of Stress : Not on file  Social Connections:   . Frequency of Communication with Friends and Family: Not on file  . Frequency of Social Gatherings with Friends and Family: Not on file  . Attends Religious Services: Not on file  . Active Member of Clubs or Organizations: Not on file  . Attends Banker Meetings: Not on  file  . Marital Status: Not on file  Intimate Partner Violence:   . Fear of Current or Ex-Partner: Not on file  . Emotionally Abused: Not on file  . Physically Abused: Not on file  . Sexually Abused: Not on file    Outpatient Medications Prior to Visit  Medication Sig Dispense Refill  . fluticasone (FLONASE) 50 MCG/ACT nasal spray Place 1 spray into the nose daily.    Marland Kitchen triamcinolone ointment (KENALOG) 0.5 % Apply 1 application topically 2 (two) times daily. (Patient not taking: Reported on 01/17/2020) 15 g 2  . meloxicam (MOBIC) 15 MG tablet 1 tab po qd prn foot pain (Patient not taking: Reported on 01/17/2020) 30 tablet 1  . methylPREDNISolone (MEDROL DOSEPAK) 4 MG TBPK tablet Take as directed (Patient not taking: Reported on 07/10/2019) 21 tablet 0  . pimecrolimus (ELIDEL) 1 % cream Apply topically 2 (two) times daily. (Patient not taking: Reported on 01/17/2020) 60 g 11   No facility-administered medications prior to visit.    Allergies  Allergen Reactions  . Methylprednisolone Itching    ROS Review of Systems  Constitutional: Negative for appetite change, chills, fatigue and fever.  HENT: Negative for congestion, dental problem, ear pain and sore throat.   Eyes: Negative for discharge, redness and visual disturbance.  Respiratory: Negative for cough, chest tightness, shortness of breath and wheezing.   Cardiovascular: Positive for chest pain. Negative for palpitations and leg swelling.  Gastrointestinal: Negative for abdominal pain, blood in stool, diarrhea, nausea and vomiting.  Genitourinary: Negative for difficulty urinating, dysuria, flank pain, frequency, hematuria and urgency.  Musculoskeletal: Negative for arthralgias, back pain, joint swelling, myalgias and neck stiffness.  Skin: Positive for rash. Negative for pallor.  Neurological: Negative for dizziness, speech difficulty, weakness and headaches.  Hematological: Negative for adenopathy. Does not bruise/bleed easily.   Psychiatric/Behavioral: Negative for confusion and sleep disturbance. The patient is not nervous/anxious.     PE; Blood pressure 113/80, pulse 78, temperature 98.5 F (36.9 C), temperature source Temporal, resp. rate 16, height 5\' 9"  (1.753 m), weight 200 lb 9.6 oz (91 kg), SpO2 99 %. Body mass index is 29.62 kg/m.  Gen: Alert, well appearing.  Patient is oriented to person, place, time, and situation. AFFECT: pleasant, lucid thought and speech. ENT: Ears: EACs clear, normal epithelium.  TMs with good light reflex and landmarks bilaterally.  Eyes: no injection, icteris, swelling, or exudate.  EOMI, PERRLA. Nose: no drainage or turbinate edema/swelling.  No injection or focal lesion.  Mouth: lips without  lesion/swelling.  Oral mucosa pink and moist.  Dentition intact and without obvious caries or gingival swelling.  Oropharynx without erythema, exudate, or swelling.  Neck: supple/nontender.  No LAD, mass, or TM.  Carotid pulses 2+ bilaterally, without bruits. CV: RRR, no m/r/g.   LUNGS: CTA bilat, nonlabored resps, good aeration in all lung fields. ABD: soft, NT, ND, BS normal.  No hepatospenomegaly or mass.  No bruits. EXT: no clubbing, cyanosis, or edema.  Musculoskeletal: no joint swelling, erythema, warmth, or tenderness.  ROM of all joints intact. Skin - no sores or suspicious lesions.  No pallor or jaundice. He has some scattered 1-2 cm patches of dry skin with mild superficial desquamation, no erythema. He does have icthyotic changes diffusely on legs.   Pertinent labs:  Lab Results  Component Value Date   TSH 2.46 01/17/2019   Lab Results  Component Value Date   WBC 7.2 01/17/2019   HGB 16.3 01/17/2019   HCT 47.7 01/17/2019   MCV 89.1 01/17/2019   PLT 216.0 01/17/2019   Lab Results  Component Value Date   CREATININE 0.86 01/17/2019   BUN 16 01/17/2019   NA 139 01/17/2019   K 4.1 01/17/2019   CL 103 01/17/2019   CO2 26 01/17/2019   Lab Results  Component Value  Date   ALT 63 (H) 01/17/2019   AST 32 01/17/2019   ALKPHOS 68 01/17/2019   BILITOT 0.9 01/17/2019   Lab Results  Component Value Date   CHOL 225 (H) 01/17/2019   Lab Results  Component Value Date   HDL 44.20 01/17/2019   Lab Results  Component Value Date   LDLCALC 118 10/18/2016   Lab Results  Component Value Date   TRIG 282.0 (H) 01/17/2019   Lab Results  Component Value Date   CHOLHDL 5 01/17/2019   Lab Results  Component Value Date   LABURIC 8.2 (H) 12/01/2017   ASSESSMENT AND PLAN:   1) Eczema: discussed chronic nature of this with pt. He now understands this and knows that repeated application of his topical med will likely be needed on/off for his lifetime.  However he still requests referral to dermatologist at this time so I ordered referral today.  2) Atypical chest pain: referred to cardiology today for expert evaluation, possible further risk stratification per pt request today. Low suspicion that this pain is cardiac related, no I'll pursue any w/u at this time.  3) Health maintenance exam: Reviewed age and gender appropriate health maintenance issues (prudent diet, regular exercise, health risks of tobacco and excessive alcohol, use of seatbelts, fire alarms in home, use of sunscreen).  Also reviewed age and gender appropriate health screening as well as vaccine recommendations. Vaccines: Tdap and flu UTD. Labs: fasting HP and uric acid level (gout). Prostate ca screening: average risk patient= as per latest guidelines, start screening at 45-50 yrs of age. Colon ca screening: average risk patient= as per latest guidelines, start screening at 46 yrs of age.   An After Visit Summary was printed and given to the patient.  FOLLOW UP:  Return for annual CPE (fasting).  Signed:  Crissie Sickles, MD           01/17/2020

## 2020-02-08 ENCOUNTER — Encounter: Payer: Self-pay | Admitting: Internal Medicine

## 2020-02-08 ENCOUNTER — Ambulatory Visit: Payer: BC Managed Care – PPO | Admitting: Internal Medicine

## 2020-02-08 ENCOUNTER — Other Ambulatory Visit: Payer: Self-pay

## 2020-02-08 VITALS — BP 114/72 | HR 78 | Ht 69.0 in | Wt 203.2 lb

## 2020-02-08 DIAGNOSIS — R0789 Other chest pain: Secondary | ICD-10-CM | POA: Diagnosis not present

## 2020-02-08 DIAGNOSIS — R9431 Abnormal electrocardiogram [ECG] [EKG]: Secondary | ICD-10-CM

## 2020-02-08 DIAGNOSIS — R072 Precordial pain: Secondary | ICD-10-CM | POA: Diagnosis not present

## 2020-02-08 HISTORY — DX: Abnormal electrocardiogram (ECG) (EKG): R94.31

## 2020-02-08 NOTE — Patient Instructions (Signed)
Medication Instructions:  No changes *If you need a refill on your cardiac medications before your next appointment, please call your pharmacy*  Lab Work: none If you have labs (blood work) drawn today and your tests are completely normal, you will receive your results only by: Marland Kitchen MyChart Message (if you have MyChart) OR . A paper copy in the mail If you have any lab test that is abnormal or we need to change your treatment, we will call you to review the results.  Testing/Procedures: Calcium Score ct scan--you can schedule at checkout today.  There is a $150 out of pocket cost for this  Follow-Up: Your physician recommends that you schedule a follow-up appointment as needed with Dr. Tenny Craw.  Other Instructions

## 2020-02-08 NOTE — Progress Notes (Signed)
Cardiology Office Note   Date:  02/08/2020   ID:  Karina Lenderman, DOB 11-11-1976, MRN [id number]  PCP:  Tammi Sou, MD  Cardiologist:   Dorris Carnes, MD   Pt referred for evaluation of CP     History of Present Illness: Gene Burke is a 44 y.o. male who says that about 1.5 years ago he had finished playing basketball.    He develped an intense L sided pain   Stabbing   Not pleuritic  No positional   Went away on its one.  Since then he has occasional chest pains   Not as intense  Not associated with activity  Last one a few wks ago.    The pt walks and runs every day.    The pt had some testing done at home in [place]  Says something wasn't flowing right, there  Was a connection.   He has a hx of smoking    Has cut back to a few cigs per day     FHx is signif for CAD in maternal uncles       No outpatient medications have been marked as taking for the 02/08/20 encounter (Office Visit) with Fay Records, MD.     Allergies:   Methylprednisolone   Past Medical History:  Diagnosis Date  . 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 Chronic pain of left knee   . Eczema    Hands primarily  . Hypertriglyceridemia    TLC  . 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  . Kidney stones    bladder stone had to be retrieved (unable to get old records-->pt w/out clear idea where he got this done... "in [facility]"..[facility] urol says not their pt ever).  . Plantar fasciitis, bilateral    steroid inj by podiatry 2020 helpful  . Tobacco dependence     No past surgical history on file.   Social History:  The patient  reports that he has been smoking cigarettes. He has a 3.75 pack-year smoking history. He has never used smokeless tobacco. He reports that he does not drink alcohol or use drugs.   Family History:  The patient's family history includes Alzheimer's disease in his father; Heart attack in his maternal grandmother; Lung disease in  his maternal grandfather; Parkinson's disease in his father; Stroke (age of onset: 43) in his paternal grandfather.    ROS:  Please see the history of present illness. All other systems are reviewed and  Negative to the above problem except as noted.    PHYSICAL EXAM: VS:  BP 114/72   Pulse 78   Ht 5\' 9"  (1.753 m)   Wt 203 lb 3.2 oz (92.2 kg)   BMI 30.01 kg/m   GEN: Well nourished, well developed, in no acute distress  HEENT: normal  Neck: no JVD, carotid bruits, or masses Cardiac: RRR; no murmurs, rubs, or gallops,no edema  Respiratory:  clear to auscultation bilaterally, normal work of breathing GI: soft, nontender, nondistended, + BS  No hepatomegaly  MS: no deformity Moving all extremities   Skin: warm and dry, no rash Neuro:  Strength and sensation are intact Psych: euthymic mood, full affect   EKG:  EKG is ordered today.  SR 78 bpm   Cannot exclude IWMI     Lipid Panel    Component Value Date/Time   CHOL 220 (H) 01/17/2020 1028   TRIG 308.0 (H) 01/17/2020 1028  HDL 47.10 01/17/2020 1028   CHOLHDL 5 01/17/2020 1028   VLDL 61.6 (H) 01/17/2020 1028   LDLCALC 118 10/18/2016 0000   LDLDIRECT 114.0 01/17/2020 1028      Wt Readings from Last 3 Encounters:  02/08/20 203 lb 3.2 oz (92.2 kg)  01/17/20 200 lb 9.6 oz (91 kg)  02/06/19 207 lb 6 oz (94.1 kg)      ASSESSMENT AND PLAN:  1  Chest pain   Atypical for angina   Probably muscular vs pulmonic in origin     I reassured pt   I did encourage him to quit smoking   His exam is normal, no mumurs  No sugg of RV volume increase   2  Cardiac risk stratification:   Pt with signif FHx of CAD   Also Hx of tobacco abuse    To further risk stratify I would recomm a Calcium score CT    Will also set up for an echo to evaluate LVEF /wall motion given EKG abnormality   3  Tob abuse:   Counselled on cessation    Current medicines are reviewed at length with the patient today.  The patient does not have concerns regarding  medicines.  Signed, Dietrich Pates, MD  02/08/2020 2:17 PM    [facility] [address] [place], [place], [place]  [address] Phone: [phone]; Fax: [phone]

## 2020-02-13 ENCOUNTER — Telehealth: Payer: Self-pay | Admitting: *Deleted

## 2020-02-13 DIAGNOSIS — R0789 Other chest pain: Secondary | ICD-10-CM

## 2020-02-13 DIAGNOSIS — R072 Precordial pain: Secondary | ICD-10-CM

## 2020-02-13 NOTE — Telephone Encounter (Signed)
-----   Message from Paula Ross V, MD sent at 02/09/2020 10:17 PM EST ----- Please set pt up for echo to eval LVEF / wall motion   Confirm no wall motion abnormalities Initially I did order this but after review of records I would recommend it now.   

## 2020-02-13 NOTE — Telephone Encounter (Signed)
-----   Message from Dietrich Pates V, MD sent at 02/09/2020 10:17 PM EST ----- Please set pt up for echo to eval LVEF / wall motion   Confirm no wall motion abnormalities Initially I did order this but after review of records I would recommend it now.

## 2020-02-13 NOTE — Telephone Encounter (Signed)
Called patient, informed. Coordinated with CT.  Pt scheduled for echo and calcium score ct scan 02/27/20.

## 2020-02-25 ENCOUNTER — Inpatient Hospital Stay: Admission: RE | Admit: 2020-02-25 | Payer: BC Managed Care – PPO | Source: Ambulatory Visit

## 2020-02-27 ENCOUNTER — Other Ambulatory Visit: Payer: Self-pay

## 2020-02-27 ENCOUNTER — Ambulatory Visit (INDEPENDENT_AMBULATORY_CARE_PROVIDER_SITE_OTHER)
Admission: RE | Admit: 2020-02-27 | Discharge: 2020-02-27 | Disposition: A | Discharge status: Home / Self Care | Payer: Self-pay | Source: Ambulatory Visit | Attending: Internal Medicine | Admitting: Internal Medicine

## 2020-02-27 ENCOUNTER — Ambulatory Visit (HOSPITAL_COMMUNITY): Payer: BC Managed Care – PPO | Attending: Cardiovascular Disease

## 2020-02-27 DIAGNOSIS — R0789 Other chest pain: Secondary | ICD-10-CM | POA: Insufficient documentation

## 2020-02-27 DIAGNOSIS — R072 Precordial pain: Secondary | ICD-10-CM

## 2020-02-27 HISTORY — PX: TRANSTHORACIC ECHOCARDIOGRAM: SHX275

## 2020-02-27 HISTORY — PX: CT CORONARY CA SCORING: HXRAD806

## 2020-03-04 ENCOUNTER — Other Ambulatory Visit: Payer: Self-pay

## 2020-03-04 ENCOUNTER — Encounter: Payer: Self-pay | Admitting: Dermatology

## 2020-03-04 ENCOUNTER — Ambulatory Visit: Payer: BC Managed Care – PPO | Admitting: Dermatology

## 2020-03-04 DIAGNOSIS — L409 Psoriasis, unspecified: Secondary | ICD-10-CM | POA: Diagnosis not present

## 2020-03-04 MED ORDER — HALOBETASOL PROPIONATE 0.05 % EX CREA: TOPICAL_CREAM | Freq: Every day | CUTANEOUS | 2 refills | Status: DC

## 2020-03-04 NOTE — Progress Notes (Signed)
   New Patient   Subjective  Gene Burke is a 44 y.o. male who presents for the following: Eczema (x 78yrs- hands and legs + itch tx- TAC-no help- using betamethasone currently-this does help).  psoriasis Location: arms, legs, scalp, buttock Duration: years Quality: getting worse Associated Signs/Symptoms:severe itch Modifying Factors: topical triamcinolone, betamethasone Severity: 8/10 Timing: Context:   The following portions of the chart were reviewed this encounter and updated as appropriate: Tobacco  Allergies  Meds  Problems  Med Hx  Surg Hx  Fam Hx      Objective  Well appearing patient in no apparent distress; mood and affect are within normal limits.  A focused examination was performed including scalp, ears, face, arms, legs, buttocks. Relevant physical exam findings are noted in the Assessment and Plan.  Objective  Gluteal Crease, Left Hand - Posterior, Left Lower Leg - Anterior, Neck - Posterior, Right Hand - Posterior, Right Lower Leg - Anterior: Well-marginated erythematous papules/plaques with silvery scale.  Many areas excoriated Areas examined included the hands fingers nailbeds legs scalp.  These excoriated hyperkeratotic lesions on the knuckles elbows scalp and shins better fit psoriasis than eczema.  I explained the autoimmune nature of this common problem and labeled it is being treatable but not quite curable.  He understands that scratching the lesions will prevent healing.  We also discussed the association with the metabolic syndrome.  Initially he will use halobetasol daily for 1 month on all areas of active breaking out.  He may also use a topical pramoxine cream like CeraVe itch relief as needed for itching.  I will plan on rechecking in 4 to 6 weeks, but he knows he can call if there are any earlier issues. Assessment & Plan  Psoriasis (6) Left Hand - Posterior; Right Hand - Posterior; Neck - Posterior; Left Lower Leg - Anterior; Right Lower Leg -  Anterior; Gluteal Crease  Halobetasol cream .05 + prn topical pramoxine (samples CeraVe itch relief)

## 2020-03-04 NOTE — Patient Instructions (Signed)
Psoriasis Psoriasis is a long-term (chronic) skin condition. It occurs because your body's defense system (immune system) causes skin cells to form too quickly. This causes raised, red patches (plaques) on your skin that look silvery. The patches may be on all areas of your body. They can be any size or shape. Psoriasis can come and go. It can range from mild to very bad. It cannot be passed from one person to another (is not contagious). There is no cure for this condition, but it can be helped with treatment. What are the causes? The cause of psoriasis is not known. Some things can make it worse. These are:  Skin damage, such as cuts, scrapes, sunburn, and dryness.  Not getting enough sunlight.  Some medicines.  Alcohol.  Tobacco.  Stress.  Infections. What increases the risk?  Having a family member with psoriasis.  Being very overweight (obese).  Being 20-40 years old.  Taking certain medicines. What are the signs or symptoms? There are different types of psoriasis. The types are:  Plaque. This is the most common. Symptoms include red, raised patches with a silvery coating. These may be itchy. Your nails may be crumbly or fall off.  Guttate. Symptoms include small red spots on your stomach area, arms, and legs. These may happen after you have been sick, such as with strep throat.  Inverse. Symptoms include patches in your armpits, under your breasts, private areas, or on your butt.  Pustular. Symptoms include pus-filled bumps on the palms of your hands or the soles of your feet. You also may feel very tired, weak, have a fever, and not be hungry.  Erythrodermic. Symptoms include bright red skin that looks burned. You may have a fast heartbeat and a body temperature that is too high or too low. You may be itchy or in pain.  Sebopsoriasis. Symptoms include red patches on your scalp, forehead, and face that are greasy.  Psoriatic arthritis. Symptoms include swollen,  painful joints along with scaly skin patches. How is this treated? There is no cure for this condition, but treatment can:  Help your skin heal.  Lessen itching and irritation and swelling (inflammation).  Slow the growth of new skin cells.  Help your body's defense system respond better to your skin. Treatment may include:  Creams or ointments.  Light therapy. This may include natural sunlight or light therapy in a doctor's office.  Medicines. These can help your body better manage skin cells. They may be used with light therapy or ointments. Medicines may include pills or injections. You may also get antibiotic medicines if you have an infection. Follow these instructions at home: Skin Care  Apply lotion to your skin as needed. Only use those that your doctor has said are okay.  Apply cool, wet cloths (cold compresses) to the affected areas.  Do not use a hot tub or take hot showers. Use slightly warm, not hot, water when taking showers and baths.  Do not scratch your skin. Lifestyle   Do not use any products that contain nicotine or tobacco, such as cigarettes, e-cigarettes, and chewing tobacco. If you need help quitting, ask your doctor.  Lower your stress.  Keep a healthy weight.  Go out in the sun as told by your doctor. Do not get sunburned.  Join a support group. Medicines  Take or use over-the-counter and prescription medicines only as told by your doctor.  If you were prescribed an antibiotic medicine, take it as told by your doctor.   Do not stop using the antibiotic even if you start to feel better. Alcohol use If you drink alcohol:  Limit how much you use: ? 0-1 drink a day for women. ? 0-2 drinks a day for men.  Be aware of how much alcohol is in your drink. In the [place] one drink equals one 12 oz bottle of beer (355 mL), one 5 oz glass of wine (148 mL), or one 1 oz glass of hard liquor (44 mL). General instructions  Keep a journal to track the  things that cause symptoms (triggers). Try to avoid these things.  See a counselor if you feel the support would help.  Keep all follow-up visits as told by your doctor. This is important. Contact a doctor if:  You have a fever.  Your pain gets worse.  You have more redness or warmth in the affected areas.  You have new or worse pain or stiffness in your joints.  Your nails start to break easily or pull away from the nail bed.  You feel very sad (depressed). Summary  Psoriasis is a long-term (chronic) skin condition.  There is no cure for this condition, but treatment can help manage it.  Keep a journal to track the things that cause symptoms.  Take or use over-the-counter and prescription medicines only as told by your doctor.  Keep all follow-up visits as told by your doctor. This is important. This information is not intended to replace advice given to you by your health care provider. Make sure you discuss any questions you have with your health care provider. Document Revised: 10/10/2018 Document Reviewed: 10/10/2018 Elsevier Patient Education  2020 Elsevier Inc.  

## 2020-03-16 NOTE — Addendum Note (Signed)
Addended by: Janalyn Harder on: 03/16/2020 08:52 AM   Modules accepted: Level of Service

## 2020-04-07 ENCOUNTER — Encounter: Payer: Self-pay | Admitting: Family Medicine

## 2020-04-16 ENCOUNTER — Ambulatory Visit: Payer: BC Managed Care – PPO | Admitting: Dermatology

## 2020-10-02 ENCOUNTER — Ambulatory Visit: Payer: BC Managed Care – PPO | Admitting: Family Medicine

## 2020-10-16 ENCOUNTER — Other Ambulatory Visit: Payer: Self-pay | Admitting: Dermatology

## 2021-04-01 ENCOUNTER — Other Ambulatory Visit: Payer: Self-pay

## 2021-04-01 ENCOUNTER — Encounter: Payer: Self-pay | Admitting: Family Medicine

## 2021-04-01 NOTE — Progress Notes (Deleted)
Office Note 04/01/2021  CC: No chief complaint on file.  HPI:  Gene Burke is a 45 y.o. Asian male who is here for annual health maintenance exam. A/P as of last visit 01/17/20: "1) Eczema: discussed chronic nature of this with pt. He now understands this and knows that repeated application of his topical med will likely be needed on/off for his lifetime.  However he still requests referral to dermatologist at this time so I ordered referral today.  2) Atypical chest pain: referred to cardiology today for expert evaluation, possible further risk stratification per pt request today. Low suspicion that this pain is cardiac related, no I'll pursue any w/u at this time.  3) Health maintenance exam: Reviewed age and gender appropriate health maintenance issues (prudent diet, regular exercise, health risks of tobacco and excessive alcohol, use of seatbelts, fire alarms in home, use of sunscreen).  Also reviewed age and gender appropriate health screening as well as vaccine recommendations. Vaccines: Tdap and flu UTD. Labs: fasting HP and uric acid level (gout). Prostate ca screening: average risk patient= as per latest guidelines, start screening at 45-50 yrs of age. Colon ca screening: average risk patient= as per latest guidelines, start screening at 16 yrs of age."  INTERIM HX: ***    Past Medical History:  Diagnosis Date  . 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 Chronic pain of left knee   . Hypertriglyceridemia    TLC  . 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  . Kidney stones    bladder stone had to be retrieved (unable to get old records-->pt w/out clear idea where he got this done... "in [facility]"..[facility] urol says not their pt ever).  . Plantar fasciitis, bilateral    steroid inj by podiatry 2020 helpful  . Psoriasis 2021   Hands primarily (Dr. Jorja Loa)  . Tobacco dependence     No past surgical history on  file.  Family History  Problem Relation Age of Onset  . Alzheimer's disease Father   . Parkinson's disease Father   . Heart attack Maternal Grandmother   . Lung disease Maternal Grandfather   . Stroke Paternal Grandfather 42  . Cancer Neg Hx   . Diabetes Neg Hx     Social History   Socioeconomic History  . Marital status: Married    Spouse name: Not on file  . Number of children: Not on file  . Years of education: Not on file  . Highest education level: Not on file  Occupational History  . Not on file  Tobacco Use  . Smoking status: Current Every Day Smoker    Packs/day: 0.25    Years: 15.00    Pack years: 3.75    Types: Cigarettes  . Smokeless tobacco: Never Used  . Tobacco comment: has bought patches  Vaping Use  . Vaping Use: Never used  Substance and Sexual Activity  . Alcohol use: No  . Drug use: No  . Sexual activity: Not on file  Other Topics Concern  . Not on file  Social History Narrative   Married, 1 son and 1 daughter.   Orig from [place].  Relocated to [place] 2000, got Ph.D in finance at [employer].     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   Tob: 1 pack q 3 d: approx 10 pack-yr hx.     No alc.   Social Determinants of Health   Financial Resource Strain: Not on file  Food Insecurity: Not on file  Transportation Needs: Not on file  Physical Activity: Not on file  Stress: Not on file  Social Connections: Not on file  Intimate Partner Violence: Not on file    Outpatient Medications Prior to Visit  Medication Sig Dispense Refill  . allopurinol (ZYLOPRIM) 300 MG tablet TAKE 1 TABLET BY MOUTH DAILY FOR 30 DAYS.    . febuxostat (ULORIC) 40 MG tablet Take 1 tablet by mouth daily.    . halobetasol (ULTRAVATE) 0.05 % cream APPLY TO AFFECTED AREA EVERY DAY FOR 14 DAYS 15 g 2  . HYDROcodone-acetaminophen (NORCO) 7.5-325 MG tablet Take by mouth.     No facility-administered medications prior to visit.    Allergies  Allergen Reactions  . Methylprednisolone Itching     ROS *** PE; Vitals with BMI 02/08/2020 01/17/2020 06/12/2019  Height 5\' 9"  5\' 9"  -  Weight 203 lbs 3 oz 200 lbs 10 oz -  BMI 29.99 29.61 -  Systolic 114 113  Diastolic 72 80 85  Pulse 78 78 -     *** Pertinent labs:  Lab Results  Component Value Date   TSH 2.32 01/17/2020   Lab Results  Component Value Date   WBC 6.9 01/17/2020   HGB 15.7 01/17/2020   HCT 45.5 01/17/2020   MCV 89.3 01/17/2020   PLT 198.0 01/17/2020   Lab Results  Component Value Date   CREATININE 0.85 01/17/2020   BUN 12 01/17/2020   NA 139 01/17/2020   K 4.3 01/17/2020   CL 107 01/17/2020   CO2 26 01/17/2020   Lab Results  Component Value Date   ALT 42 01/17/2020   AST 24 01/17/2020   ALKPHOS 74 01/17/2020   BILITOT 0.8 01/17/2020   Lab Results  Component Value Date   CHOL 220 (H) 01/17/2020   Lab Results  Component Value Date   HDL 47.10 01/17/2020   Lab Results  Component Value Date   LDLCALC 118 10/18/2016   Lab Results  Component Value Date   TRIG 308.0 (H) 01/17/2020   Lab Results  Component Value Date   CHOLHDL 5 01/17/2020    ASSESSMENT AND PLAN:   No problem-specific Assessment & Plan notes found for this encounter.  Health maintenance exam: Reviewed age and gender appropriate health maintenance issues (prudent diet, regular exercise, health risks of tobacco and excessive alcohol, use of seatbelts, fire alarms in home, use of sunscreen).  Also reviewed age and gender appropriate health screening as well as vaccine recommendations. Vaccines: UTD. Labs: fasting HP labs ordered. Prostate ca screening: average risk patient= as per latest guidelines, start screening at 69 yrs of age. Colon ca screening: average risk patient= as per latest guidelines, start screening at 71 yrs of age.  An After Visit Summary was printed and given to the patient.  FOLLOW UP:  No follow-ups on file.  Signed:  44, MD           04/01/2021

## 2021-04-02 ENCOUNTER — Encounter: Payer: Self-pay | Admitting: Family Medicine

## 2021-04-02 DIAGNOSIS — E781 Pure hyperglyceridemia: Secondary | ICD-10-CM

## 2021-04-02 DIAGNOSIS — Z0000 Encounter for general adult medical examination without abnormal findings: Secondary | ICD-10-CM

## 2021-04-22 ENCOUNTER — Encounter: Payer: Self-pay | Admitting: Family Medicine

## 2021-04-22 ENCOUNTER — Other Ambulatory Visit: Payer: Self-pay

## 2021-04-22 ENCOUNTER — Ambulatory Visit (INDEPENDENT_AMBULATORY_CARE_PROVIDER_SITE_OTHER): Payer: BC Managed Care – PPO | Admitting: Family Medicine

## 2021-04-22 VITALS — BP 103/69 | HR 53 | Temp 97.6°F | Resp 16 | Ht 69.0 in | Wt 199.2 lb

## 2021-04-22 DIAGNOSIS — Z0000 Encounter for general adult medical examination without abnormal findings: Secondary | ICD-10-CM

## 2021-04-22 DIAGNOSIS — Z1159 Encounter for screening for other viral diseases: Secondary | ICD-10-CM

## 2021-04-22 DIAGNOSIS — E781 Pure hyperglyceridemia: Secondary | ICD-10-CM

## 2021-04-22 LAB — CBC WITH DIFFERENTIAL/PLATELET
Basophils Absolute: 0.1 10*3/uL (ref 0.0–0.1)
Basophils Relative: 1.1 % (ref 0.0–3.0)
Eosinophils Absolute: 0.2 10*3/uL (ref 0.0–0.7)
Eosinophils Relative: 2.9 % (ref 0.0–5.0)
HCT: 45.6 % (ref 39.0–52.0)
Hemoglobin: 15.5 g/dL (ref 13.0–17.0)
Lymphocytes Relative: 38.3 % (ref 12.0–46.0)
Lymphs Abs: 2.7 10*3/uL (ref 0.7–4.0)
MCHC: 33.9 g/dL (ref 30.0–36.0)
MCV: 90.2 fl (ref 78.0–100.0)
Monocytes Absolute: 0.5 10*3/uL (ref 0.1–1.0)
Monocytes Relative: 7.7 % (ref 3.0–12.0)
Neutro Abs: 3.6 10*3/uL (ref 1.4–7.7)
Neutrophils Relative %: 50 % (ref 43.0–77.0)
Platelets: 226 10*3/uL (ref 150.0–400.0)
RBC: 5.06 Mil/uL (ref 4.22–5.81)
RDW: 12.4 % (ref 11.5–15.5)
WBC: 7.1 10*3/uL (ref 4.0–10.5)

## 2021-04-22 LAB — LIPID PANEL
Cholesterol: 238 mg/dL — ABNORMAL HIGH (ref 0–200)
HDL: 43.5 mg/dL (ref >39.00)
NonHDL: 194.13
Total CHOL/HDL Ratio: 5
Triglycerides: 277 mg/dL — ABNORMAL HIGH (ref 0.0–149.0)
VLDL: 55.4 mg/dL — ABNORMAL HIGH (ref 0.0–40.0)

## 2021-04-22 LAB — LDL CHOLESTEROL, DIRECT: Direct LDL: 150 mg/dL

## 2021-04-22 LAB — COMPREHENSIVE METABOLIC PANEL
ALT: 30 U/L (ref 0–53)
AST: 20 U/L (ref 0–37)
Albumin: 4.5 g/dL (ref 3.5–5.2)
Alkaline Phosphatase: 70 U/L (ref 39–117)
BUN: 15 mg/dL (ref 6–23)
CO2: 26 mEq/L (ref 19–32)
Calcium: 9.7 mg/dL (ref 8.4–10.5)
Chloride: 105 mEq/L (ref 96–112)
Creatinine, Ser: 1.01 mg/dL (ref 0.40–1.50)
GFR: 90.41 mL/min (ref >60.00)
Glucose, Bld: 97 mg/dL (ref 70–99)
Potassium: 4.1 mEq/L (ref 3.5–5.1)
Sodium: 139 mEq/L (ref 135–145)
Total Bilirubin: 1 mg/dL (ref 0.2–1.2)
Total Protein: 6.9 g/dL (ref 6.0–8.3)

## 2021-04-22 LAB — TSH: TSH: 3.71 u[IU]/mL (ref 0.35–4.50)

## 2021-04-22 NOTE — Addendum Note (Signed)
Addended by: Jeoffrey Massed on: 04/22/2021 04:46 PM   Modules accepted: Orders

## 2021-04-22 NOTE — Patient Instructions (Signed)

## 2021-04-22 NOTE — Progress Notes (Signed)
Office Note 04/22/2021  CC:  Chief Complaint  Patient presents with  . Annual Exam    Fasting   HPI:  Gene Burke is a 45 y.o. Asian male who is here for annual health maintenance exam I last saw him 01/17/20. A/P as of that visit: 1) Eczema: discussed chronic nature of this with pt. He now understands this and knows that repeated application of his topical med will likely be needed on/off for his lifetime.  However he still requests referral to dermatologist at this time so I ordered referral today.  2) Atypical chest pain: referred to cardiology today for expert evaluation, possible further risk stratification per pt request today. Low suspicion that this pain is cardiac related, no I'll pursue any w/u at this time.  3) Health maintenance exam: Reviewed age and gender appropriate health maintenance issues (prudent diet, regular exercise, health risks of tobacco and excessive alcohol, use of seatbelts, fire alarms in home, use of sunscreen).  Also reviewed age and gender appropriate health screening as well as vaccine recommendations. Vaccines: Tdap and flu UTD. Labs: fasting HP and uric acid level (gout). Prostate ca screening: average risk patient= as per latest guidelines, start screening at 45-50 yrs of age. Colon ca screening: average risk patient= as per latest guidelines, start screening at 45 yrs of age."  INTERIM HX: Feeling well.  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 Says walking for exercise, occ running. Says eats too much rice.  Still smoking?->yes, but says he doesn't inhale as much anymore, 1 pack q 3d. Not fully committed to quitting at this time.  F/u with urol for kidney stones since I last saw him: has bilat small stones, feels like he passes some small stones intermittently. He was rx'd uloric and allopurinol in the past by urol but pt declined to take them.    Past Medical History:  Diagnosis Date  . Abnormal EKG 02/08/2020   while asymptomatic->Q  waves in III and aVF--"possible IWMI".  Echo normal wall motion.  Marland Kitchen Atypical chest pain    cards 2021->coronary calcium zero, echo mild LVH, normal wall motion.  . 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 Chronic pain of left knee   . Hepatic steatosis 2021   Noted on CT renal stone study done 10/2020  . Hypertriglyceridemia    TLC  . 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  . Kidney stones    bladder stone had to be retrieved (unable to get old records-->pt w/out clear idea where he got this done... "in [facility]"..[facility] urol says not their pt ever).  L Ureteral stone 10/2020.  Marland Kitchen Plantar fasciitis, bilateral    steroid inj by podiatry 2020 helpful  . Psoriasis 2021   Hands primarily (Dr. Jorja Loa)  . Tobacco dependence    Past Surgical History:  Procedure Laterality Date  . CT CORONARY CA SCORING  02/27/2020   ZERO  . TRANSTHORACIC ECHOCARDIOGRAM  02/27/2020   EF 60-65%, mild LVH, normal wall motion, normal valves    Family History  Problem Relation Age of Onset  . Alzheimer's disease Father   . Parkinson's disease Father   . Heart attack Maternal Grandmother   . Lung disease Maternal Grandfather   . Stroke Paternal Grandfather 57  . Cancer Neg Hx   . Diabetes Neg Hx     Social History   Socioeconomic History  . Marital status: Married    Spouse name: Not on file  .  Number of children: Not on file  . Years of education: Not on file  . Highest education level: Not on file  Occupational History  . Not on file  Tobacco Use  . Smoking status: Current Every Day Smoker    Packs/day: 0.25    Years: 15.00    Pack years: 3.75    Types: Cigarettes  . Smokeless tobacco: Never Used  . Tobacco comment: has bought patches  Vaping Use  . Vaping Use: Never used  Substance and Sexual Activity  . Alcohol use: No  . Drug use: No  . Sexual activity: Not on file  Other Topics Concern  . Not on file  Social History Narrative    Married, 1 son and 1 daughter.   Orig from [place].  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   Tob: 1 pack q 3 d: approx 10 pack-yr hx.     No alc.   Social Determinants of Health   Financial Resource Strain: Not on file  Food Insecurity: Not on file  Transportation Needs: Not on file  Physical Activity: Not on file  Stress: Not on file  Social Connections: Not on file  Intimate Partner Violence: Not on file    Outpatient Medications Prior to Visit  Medication Sig Dispense Refill  . halobetasol (ULTRAVATE) 0.05 % cream APPLY TO AFFECTED AREA EVERY DAY FOR 14 DAYS 15 g 2  . allopurinol (ZYLOPRIM) 300 MG tablet TAKE 1 TABLET BY MOUTH DAILY FOR 30 DAYS. (Patient not taking: Reported on 04/22/2021)    . febuxostat (ULORIC) 40 MG tablet Take 1 tablet by mouth daily. (Patient not taking: Reported on 04/22/2021)    . HYDROcodone-acetaminophen (NORCO) 7.5-325 MG tablet Take by mouth. (Patient not taking: Reported on 04/22/2021)     No facility-administered medications prior to visit.    Allergies  Allergen Reactions  . Methylprednisolone Itching    ROS Review of Systems  Constitutional: Negative for appetite change, chills, fatigue and fever.  HENT: Negative for congestion, dental problem, ear pain and sore throat.   Eyes: Negative for discharge, redness and visual disturbance.  Respiratory: Negative for cough, chest tightness, shortness of breath and wheezing.   Cardiovascular: Negative for chest pain, palpitations and leg swelling.  Gastrointestinal: Negative for abdominal pain, blood in stool, diarrhea, nausea and vomiting.  Genitourinary: Negative for difficulty urinating, dysuria, flank pain, frequency, hematuria and urgency.  Musculoskeletal: Negative for arthralgias, back pain, joint swelling, myalgias and neck stiffness.  Skin: Negative for pallor and rash.  Neurological: Negative for dizziness, speech difficulty, weakness and headaches.   Hematological: Negative for adenopathy. Does not bruise/bleed easily.  Psychiatric/Behavioral: Negative for confusion and sleep disturbance. The patient is not nervous/anxious.     PE; Vitals with BMI 04/22/2021 02/08/2020 01/17/2020  Height 5\' 9"  5\' 9"  5\' 9"   Weight 199 lbs 3 oz 203 lbs 3 oz 200 lbs 10 oz  BMI 29.4 29.99 29.61  Systolic 103 114  Diastolic 69 72 80  Pulse 53 78 78    Gen: Alert, well appearing.  Patient is oriented to person, place, time, and situation. AFFECT: pleasant, lucid thought and speech. ENT: Ears: EACs clear, normal epithelium.  TMs with good light reflex and landmarks bilaterally.  Eyes: no injection, icteris, swelling, or exudate.  EOMI, PERRLA. Nose: no drainage or turbinate edema/swelling.  No injection or focal lesion.  Mouth: lips without lesion/swelling.  Oral mucosa pink and  moist.  Dentition intact and without obvious caries or gingival swelling.  Oropharynx without erythema, exudate, or swelling.  Neck: supple/nontender.  No LAD, mass, or TM.  Carotid pulses 2+ bilaterally, without bruits. CV: RRR, no m/r/g.   LUNGS: CTA bilat, nonlabored resps, good aeration in all lung fields. ABD: soft, NT, ND, BS normal.  No hepatospenomegaly or mass.  No bruits. EXT: no clubbing, cyanosis, or edema.  Musculoskeletal: no joint swelling, erythema, warmth, or tenderness.  ROM of all joints intact. Skin - no sores or suspicious lesions or rashes or color changes   Pertinent labs:  Lab Results  Component Value Date   TSH 2.32 01/17/2020   Lab Results  Component Value Date   WBC 6.9 01/17/2020   HGB 15.7 01/17/2020   HCT 45.5 01/17/2020   MCV 89.3 01/17/2020   PLT 198.0 01/17/2020   Lab Results  Component Value Date   CREATININE 0.85 01/17/2020   BUN 12 01/17/2020   NA 139 01/17/2020   K 4.3 01/17/2020   CL 107 01/17/2020   CO2 26 01/17/2020   Lab Results  Component Value Date   ALT 42 01/17/2020   AST 24 01/17/2020   ALKPHOS 74 01/17/2020    BILITOT 0.8 01/17/2020   Lab Results  Component Value Date   CHOL 220 (H) 01/17/2020   Lab Results  Component Value Date   HDL 47.10 01/17/2020   Lab Results  Component Value Date   LDLCALC 118 10/18/2016   Lab Results  Component Value Date   TRIG 308.0 (H) 01/17/2020   Lab Results  Component Value Date   CHOLHDL 5 01/17/2020   Lab Results  Component Value Date   LABURIC 7.4 01/17/2020   ASSESSMENT AND PLAN:   1) Health maintenance exam: Reviewed age and gender appropriate health maintenance issues (prudent diet, regular exercise, health risks of tobacco and excessive alcohol, use of seatbelts, fire alarms in home, use of sunscreen).  Also reviewed age and gender appropriate health screening as well as vaccine recommendations. Vaccines: ALL UTD. Labs: fasting HP today. Prostate ca screening: average risk patient= as per latest guidelines, start screening at 71 yrs of age. Colon ca screening: average risk patient= as per latest guidelines, start screening at 20 yrs of age.  2) Hx nephrolithiasis: sounds like urol dx'd him with uric acid stones but I have no records. He declined to take the allopurinol or uloric they rx'd him.  I encouraged him to f/u with urology.  3) Tobacco dependence: encouraged cessation but he's not quite ready to try to quit at this time.  An After Visit Summary was printed and given to the patient.  FOLLOW UP:  Return in about 1 year (around 04/22/2022) for annual CPE (fasting).  Signed:  Santiago Bumpers, MD           04/22/2021

## 2021-05-30 IMAGING — CT CT CARDIAC CORONARY ARTERY CALCIUM SCORE
3 series · 14 of 20 positions shown, 16 images · non-contrast
Comparison: None.
COMPARISON: None.

Addendum:
EXAM:
OVER-READ INTERPRETATION  CT CHEST

The following report is an over-read performed by radiologist Dr.
Md S R Azharul Islam [[email or web address]] on 02/27/2020. This over-read
does not include interpretation of cardiac or coronary anatomy or
pathology. The coronary calcium score interpretation by the
cardiologist is attached.
CLINICAL DATA: Risk stratification
Coronary Calcium Score
TECHNIQUE: The patient was scanned on a Siemens Force scanner. Axial
non-contrast 3 mm slices were carried out through the heart. The
data set was analyzed on a dedicated work station and scored using
the Agatson method.

[Series 3: lung 70 % · axial · 0.71mm/px · z∈[-248,-176]mm · 4 of 42 slices shown]
[im 9/42  lung]
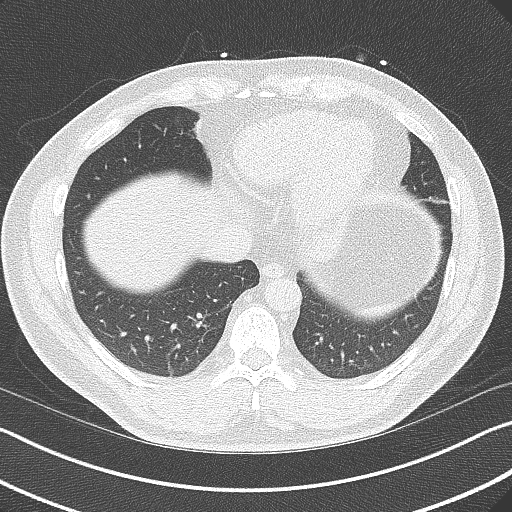
[im 17/42  lung]
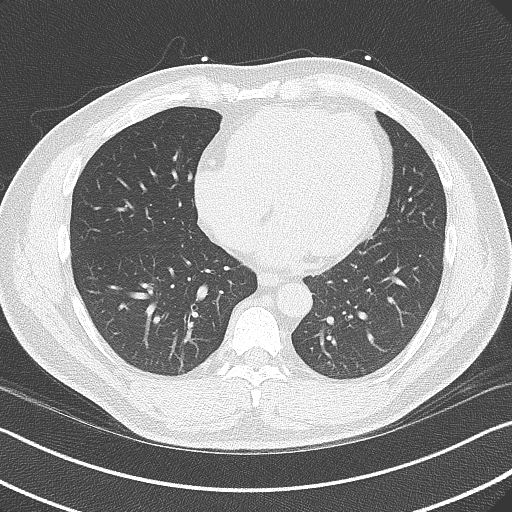
[im 25/42  lung]
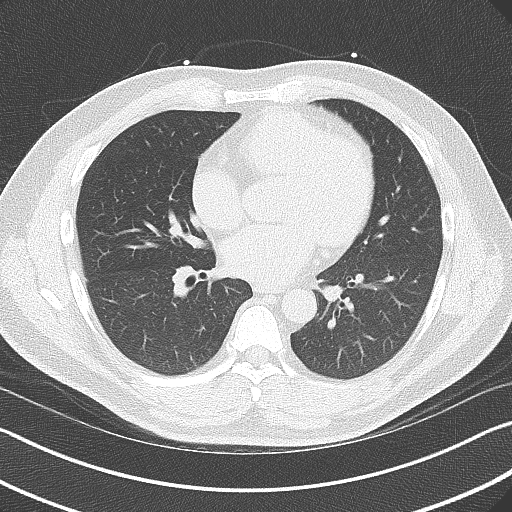
[im 33/42  lung]
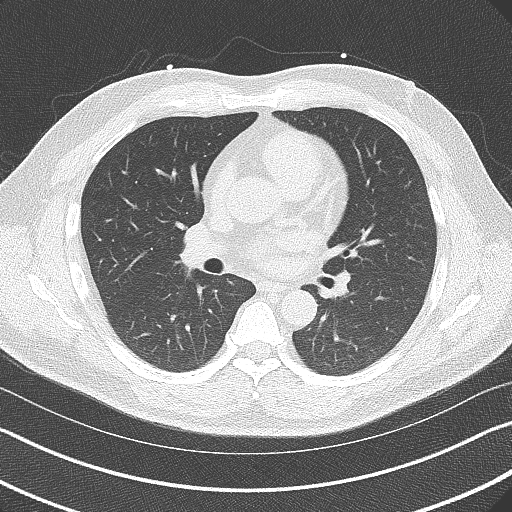

[Series 4: lung st 70 % · axial · 0.71mm/px · z∈[-254,-170]mm · 5 of 42 slices shown]
[im 7/42  lung]
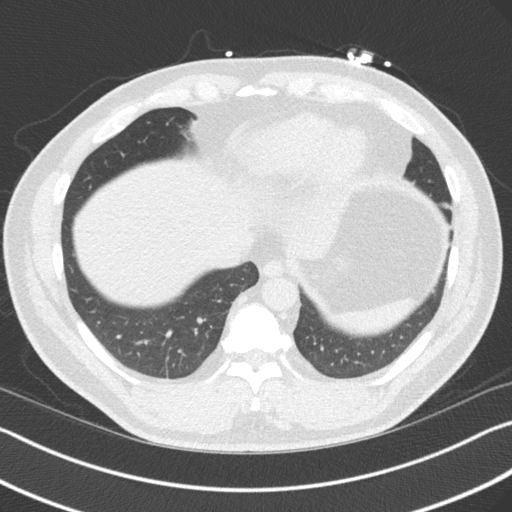
[im 14/42  lung]
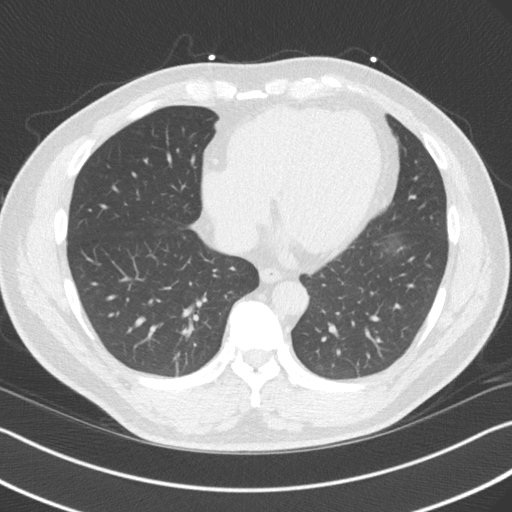
[im 21/42  lung]
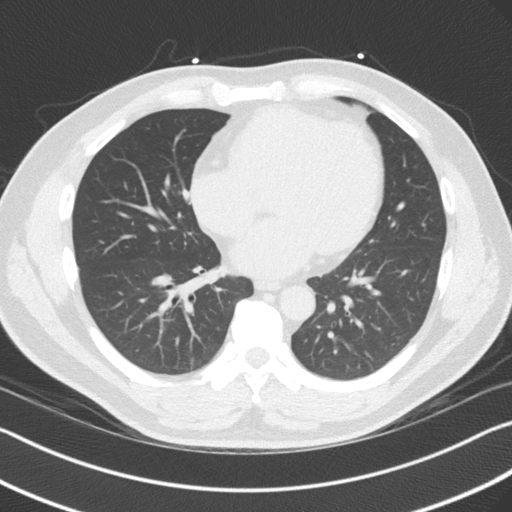
[im 28/42  lung]
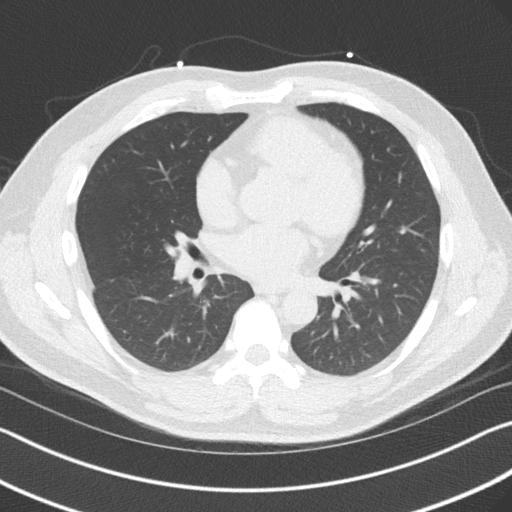
[im 35/42  lung]
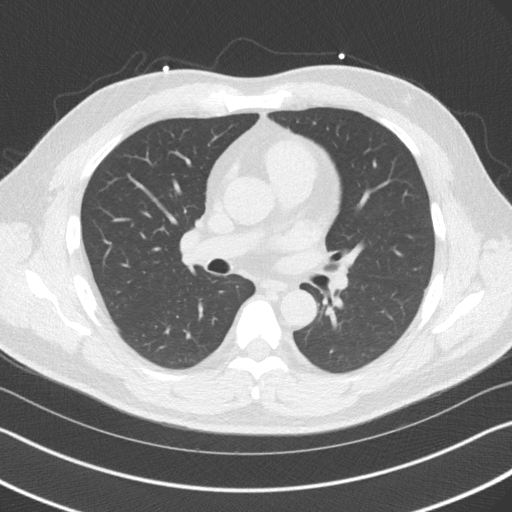

[Series 7: casc 3.0 bv41 2 bestdiast 71 % · axial · 0.41mm/px · z∈[-255,-171]mm · 5 of 42 slices shown, 7 images]
[im 7/42  vessel]
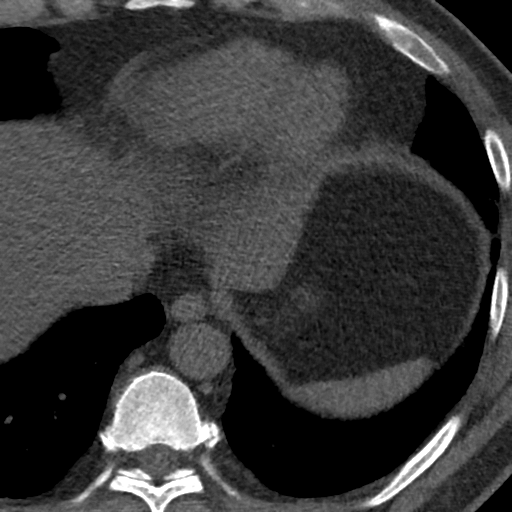
[im 7/42  lung]
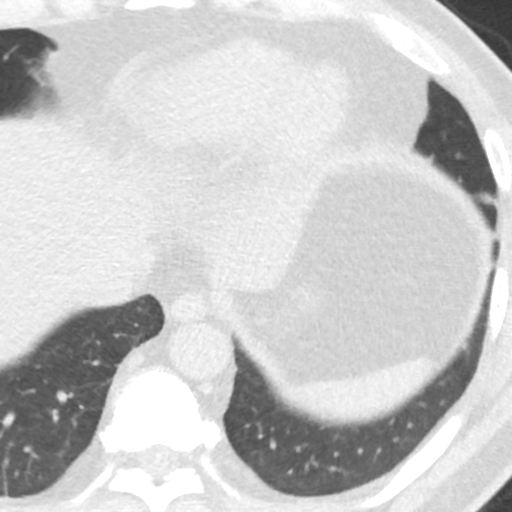
[im 14/42  vessel]
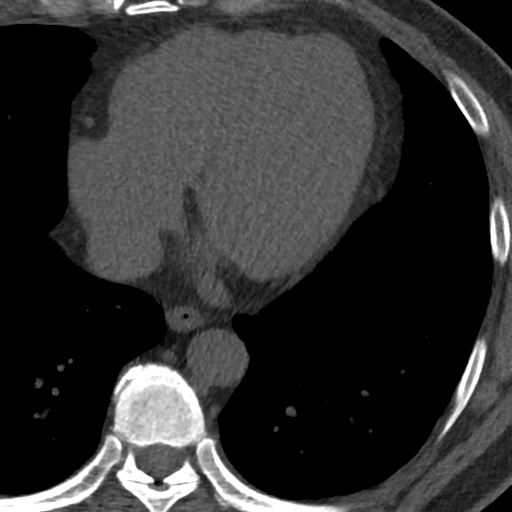
[im 21/42  vessel]
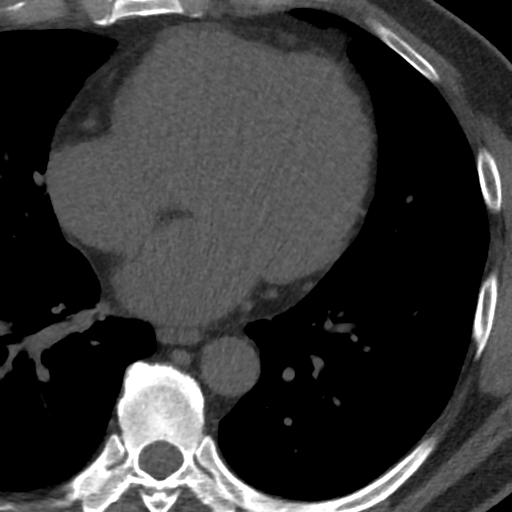
[im 28/42  vessel]
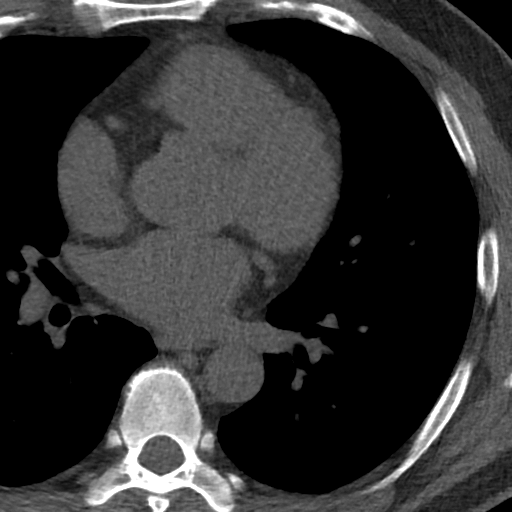
[im 35/42  vessel]
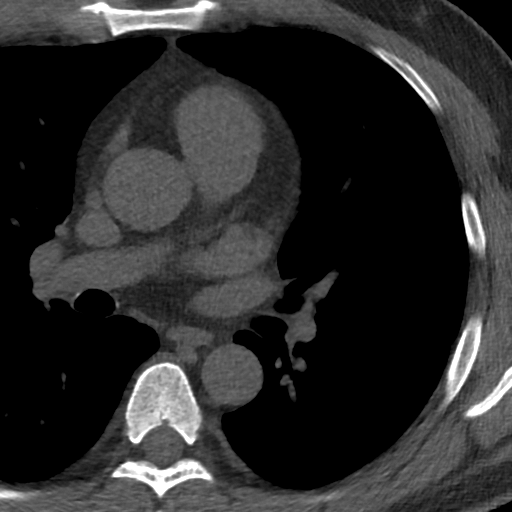
[im 35/42  lung]
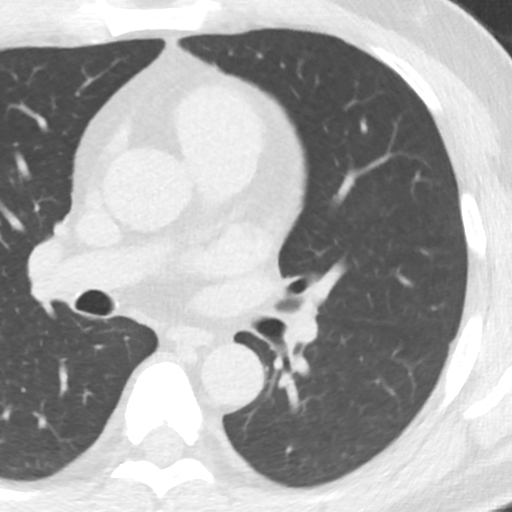

[14 of 20 positions shown; findings below may reference images not displayed]

FINDINGS: Vascular: Heart is normal size.  Visualized aorta normal caliber.

Mediastinum/Nodes: No adenopathy in the lower mediastinum or hila.

Lungs/Pleura: No confluent opacities or effusions.

Upper Abdomen: Diffuse fatty infiltration of the liver.

Musculoskeletal: Chest wall soft tissues are unremarkable. No acute
bony abnormality.
IMPRESSION: Mild diffuse fatty infiltration of the liver.

No acute extra cardiac abnormality.
FINDINGS: Non-cardiac: See separate report from [REDACTED].

Ascending Aorta: 32 mm at mid ascending aorta measured in axial
plane.

Pericardium: Normal

Coronary arteries:

Coronary calcium score of 0. This was 0 percentile for age and sex
matched control.
IMPRESSION: Coronary calcium score of 0. This was 0 percentile for age and sex
matched control.

*** End of Addendum ***
EXAM:
OVER-READ INTERPRETATION  CT CHEST

The following report is an over-read performed by radiologist Dr.
Md S R Azharul Islam [REDACTED] on 02/27/2020. This over-read
does not include interpretation of cardiac or coronary anatomy or
pathology. The coronary calcium score interpretation by the
cardiologist is attached.
FINDINGS: Vascular: Heart is normal size.  Visualized aorta normal caliber.

Mediastinum/Nodes: No adenopathy in the lower mediastinum or hila.

Lungs/Pleura: No confluent opacities or effusions.

Upper Abdomen: Diffuse fatty infiltration of the liver.

Musculoskeletal: Chest wall soft tissues are unremarkable. No acute
bony abnormality.
IMPRESSION: Mild diffuse fatty infiltration of the liver.

No acute extra cardiac abnormality.

## 2022-02-22 DIAGNOSIS — R82992 Hyperoxaluria: Secondary | ICD-10-CM | POA: Insufficient documentation

## 2022-02-22 HISTORY — PX: NEPHROLITHOTOMY: SHX5134

## 2022-04-22 ENCOUNTER — Encounter: Payer: Self-pay | Admitting: Family Medicine

## 2022-04-22 ENCOUNTER — Ambulatory Visit (INDEPENDENT_AMBULATORY_CARE_PROVIDER_SITE_OTHER): Payer: BC Managed Care – PPO | Admitting: Family Medicine

## 2022-04-22 VITALS — BP 95/59 | HR 60 | Temp 97.5°F | Ht 69.0 in | Wt 181.0 lb

## 2022-04-22 DIAGNOSIS — Z1211 Encounter for screening for malignant neoplasm of colon: Secondary | ICD-10-CM

## 2022-04-22 DIAGNOSIS — Z0000 Encounter for general adult medical examination without abnormal findings: Secondary | ICD-10-CM | POA: Diagnosis not present

## 2022-04-22 DIAGNOSIS — E790 Hyperuricemia without signs of inflammatory arthritis and tophaceous disease: Secondary | ICD-10-CM

## 2022-04-22 LAB — CBC
HCT: 44.3 % (ref 39.0–52.0)
Hemoglobin: 14.8 g/dL (ref 13.0–17.0)
MCHC: 33.5 g/dL (ref 30.0–36.0)
MCV: 91.5 fl (ref 78.0–100.0)
Platelets: 187 10*3/uL (ref 150.0–400.0)
RBC: 4.84 Mil/uL (ref 4.22–5.81)
RDW: 12.6 % (ref 11.5–15.5)
WBC: 5.4 10*3/uL (ref 4.0–10.5)

## 2022-04-22 LAB — COMPREHENSIVE METABOLIC PANEL
ALT: 20 U/L (ref 0–53)
AST: 17 U/L (ref 0–37)
Albumin: 4.5 g/dL (ref 3.5–5.2)
Alkaline Phosphatase: 62 U/L (ref 39–117)
BUN: 19 mg/dL (ref 6–23)
CO2: 26 mEq/L (ref 19–32)
Calcium: 9.2 mg/dL (ref 8.4–10.5)
Chloride: 105 mEq/L (ref 96–112)
Creatinine, Ser: 0.88 mg/dL (ref 0.40–1.50)
GFR: 103.8 mL/min (ref >60.00)
Glucose, Bld: 82 mg/dL (ref 70–99)
Potassium: 4.2 mEq/L (ref 3.5–5.1)
Sodium: 140 mEq/L (ref 135–145)
Total Bilirubin: 1 mg/dL (ref 0.2–1.2)
Total Protein: 6.9 g/dL (ref 6.0–8.3)

## 2022-04-22 LAB — LDL CHOLESTEROL, DIRECT: Direct LDL: 96 mg/dL

## 2022-04-22 LAB — LIPID PANEL
Cholesterol: 223 mg/dL — ABNORMAL HIGH (ref 0–200)
HDL: 39.2 mg/dL (ref >39.00)
Total CHOL/HDL Ratio: 6
Triglycerides: 401 mg/dL — ABNORMAL HIGH (ref 0.0–149.0)

## 2022-04-22 LAB — TSH: TSH: 1.89 u[IU]/mL (ref 0.35–5.50)

## 2022-04-22 LAB — URIC ACID: Uric Acid, Serum: 8.4 mg/dL — ABNORMAL HIGH (ref 4.0–7.8)

## 2022-04-22 MED ORDER — HALOBETASOL PROPIONATE 0.05 % EX CREA: TOPICAL_CREAM | CUTANEOUS | 2 refills | Status: DC

## 2022-04-22 NOTE — Progress Notes (Signed)
Office Note ?04/22/2022 ? ?CC:  ?Chief Complaint  ?Patient presents with  ? Annual Exam  ?  Pt is fasting. Declined colon cancer screening  ? ?HPI:  ?Patient is a 46 y.o. male who is here for annual health maintenance exam. ?Ealing very well. ?Has been running and swimming regularly for the last year and has lost 18 pounds purposefully! ? ? ? ?Past Medical History:  ?Diagnosis Date  ? Abnormal EKG 02/08/2020  ? while asymptomatic->Q waves in III and aVF--"possible IWMI".  Echo normal wall motion.  ? Atypical chest pain   ? cards 2021->coronary calcium zero, echo mild LVH, normal wall motion.  ? 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 Chronic pain of left knee   ? Hepatic steatosis 2021  ? Noted on CT renal stone study done 10/2020  ? Hypertriglyceridemia   ? TLC  ? Ichthyosis   ? primarily LL's, mild.  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  ? Kidney stones   ? bladder stone had to be retrieved (unable to get old records-->pt w/out clear idea where he got this done... "in [facility]"..[facility] says not their pt ever).  L Ureteral stone 10/2020.  ? Plantar fasciitis, bilateral   ? steroid inj by podiatry 2020 helpful  ? Psoriasis 2021  ? Hands primarily (Dr. Jorja Loaafeen)  ? Tobacco dependence   ? ? ?Past Surgical History:  ?Procedure Laterality Date  ? CT CORONARY CA SCORING  02/27/2020  ? ZERO  ? NEPHROLITHOTOMY  02/22/2022  ? TRANSTHORACIC ECHOCARDIOGRAM  02/27/2020  ? EF 60-65%, mild LVH, normal wall motion, normal valves  ? ? ?Family History  ?Problem Relation Age of Onset  ? Alzheimer's disease Father   ? Parkinson's disease Father   ? Heart attack Maternal Grandmother   ? Lung disease Maternal Grandfather   ? Stroke Paternal Grandfather [id number]  ? Cancer Neg Hx   ? Diabetes Neg Hx   ? ? ?Social History  ? ?Socioeconomic History  ? Marital status: Married  ?  Spouse name: Not on file  ? Number of children: Not on file  ? Years of education: Not on file  ? Highest education level: Not on file  ?Occupational  History  ? Not on file  ?Tobacco Use  ? Smoking status: Every Day  ?  Packs/day: 0.25  ?  Years: 15.00  ?  Pack years: 3.75  ?  Types: Cigarettes  ? Smokeless tobacco: Never  ? Tobacco comments:  ?  has bought patches  ?Vaping Use  ? Vaping Use: Never used  ?Substance and Sexual Activity  ? Alcohol use: No  ? Drug use: No  ? Sexual activity: Not on file  ?Other Topics Concern  ? Not on file  ?Social History Narrative  ? Married, 1 son and 1 daughter.  ? Orig from [place].  [place]ocated to [place] 2000, got Ph.D in finance at [employer]    ? 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  ? Tob: 1 pack q 3 d: approx 10 pack-yr hx.    ? No alc.  ? ?Social Determinants of Health  ? ?Financial Resource Strain: Not on file  ?Food Insecurity: Not on file  ?Transportation Needs: Not on file  ?Physical Activity: Not on file  ?Stress: Not on file  ?Social Connections: Not on file  ?Intimate Partner Violence: Not on file  ? ? ?Outpatient Medications Prior to Visit  ?Medication Sig Dispense Refill  ? halobetasol (ULTRAVATE) 0.05 % cream APPLY TO AFFECTED AREA  EVERY DAY FOR 14 DAYS 15 g 2  ? ?No facility-administered medications prior to visit.  ? ? ?Allergies  ?Allergen Reactions  ? Methylprednisolone Itching  ? ? ?ROS ?Review of Systems  ?Constitutional:  Negative for appetite change, chills, fatigue and fever.  ?HENT:  Negative for congestion, dental problem, ear pain and sore throat.   ?Eyes:  Negative for discharge, redness and visual disturbance.  ?Respiratory:  Negative for cough, chest tightness, shortness of breath and wheezing.   ?Cardiovascular:  Negative for chest pain, palpitations and leg swelling.  ?Gastrointestinal:  Negative for abdominal pain, blood in stool, diarrhea, nausea and vomiting.  ?Genitourinary:  Negative for difficulty urinating, dysuria, flank pain, frequency, hematuria and urgency.  ?Musculoskeletal:  Negative for arthralgias, back pain, joint swelling, myalgias and neck stiffness.  ?Skin:  Negative for pallor and rash.   ?Neurological:  Negative for dizziness, speech difficulty, weakness and headaches.  ?Hematological:  Negative for adenopathy. Does not bruise/bleed easily.  ?Psychiatric/Behavioral:  Negative for confusion and sleep disturbance. The patient is not nervous/anxious.   ? ?PE; ? ?  04/22/2022  ? 10:41 AM 04/22/2021  ?  9:03 AM 02/08/2020  ?  1:52 PM  ?Vitals with BMI  ?Height 5\' 9"  5\' 9"  5\' 9"   ?Weight 181 lbs 199 lbs 3 oz 203 lbs 3 oz  ?BMI 26.72 29.4 29.99  ?Systolic 95 103 114  ?Diastolic 59 69 72  ?Pulse 60 53 78  ? ?Gen: Alert, well appearing.  Patient is oriented to person, place, time, and situation. ?AFFECT: pleasant, lucid thought and speech. ?ENT: Ears: EACs clear, normal epithelium.  TMs with good light reflex and landmarks bilaterally.  Eyes: no injection, icteris, swelling, or exudate.  EOMI, PERRLA. ?Nose: no drainage or turbinate edema/swelling.  No injection or focal lesion.  Mouth: lips without lesion/swelling.  Oral mucosa pink and moist.  Dentition intact and without obvious caries or gingival swelling.  Oropharynx without erythema, exudate, or swelling.  ?Neck: supple/nontender.  No LAD, mass, or TM.  Carotid pulses 2+ bilaterally, without bruits. ?CV: RRR, no m/r/g.   ?LUNGS: CTA bilat, nonlabored resps, good aeration in all lung fields. ?ABD: soft, NT, ND, BS normal.  No hepatospenomegaly or mass.  No bruits. ?EXT: no clubbing, cyanosis, or edema.  ?Musculoskeletal: no joint swelling, erythema, warmth, or tenderness.  ROM of all joints intact. ?Skin - no sores or suspicious lesions or rashes or color changes ? ?Pertinent labs:  ?Lab Results  ?Component Value Date  ? TSH 3.71 04/22/2021  ? ?Lab Results  ?Component Value Date  ? WBC 7.1 04/22/2021  ? HGB 15.5 04/22/2021  ? HCT 45.6 04/22/2021  ? MCV 90.2 04/22/2021  ? PLT 226.0 04/22/2021  ? ?Lab Results  ?Component Value Date  ? CREATININE 1.01 04/22/2021  ? BUN 15 04/22/2021  ? NA 139 04/22/2021  ? K 4.1 04/22/2021  ? CL 105 04/22/2021  ? CO2 26  04/22/2021  ? ?Lab Results  ?Component Value Date  ? ALT 30 04/22/2021  ? AST 20 04/22/2021  ? ALKPHOS 70 04/22/2021  ? BILITOT 1.0 04/22/2021  ? ?Lab Results  ?Component Value Date  ? CHOL 238 (H) 04/22/2021  ? ?Lab Results  ?Component Value Date  ? HDL 43.50 04/22/2021  ? ?Lab Results  ?Component Value Date  ? LDLCALC 118 10/18/2016  ? ?Lab Results  ?Component Value Date  ? TRIG 277.0 (H) 04/22/2021  ? ?Lab Results  ?Component Value Date  ? CHOLHDL 5 04/22/2021  ? ?  Lab Results  ?Component Value Date  ? LABURIC 7.4 01/17/2020  ? ?ASSESSMENT AND PLAN:  ? ?Health maintenance exam: ?Reviewed age and gender appropriate health maintenance issues (prudent diet, regular exercise, health risks of tobacco and excessive alcohol, use of seatbelts, fire alarms in home, use of sunscreen).  Also reviewed age and gender appropriate health screening as well as vaccine recommendations. ?Vaccines: all UTD. ?Labs: fasting HP ordered ?Prostate ca screening: average risk patient= as per latest guidelines, start screening at 40 yrs of age. ?Colon ca screening: due for initial colon cancer screening.  Options discussed today-->pt declined. ? ?An After Visit Summary was printed and given to the patient. ? ?FOLLOW UP:  Return in about 1 year (around 04/23/2023) for annual CPE (fasting). ? ?Signed:  Santiago Bumpers, MD           04/22/2022 ? ?

## 2022-04-22 NOTE — Patient Instructions (Signed)
Health Maintenance, Male Adopting a healthy lifestyle and getting preventive care are important in promoting health and wellness. Ask your health care provider about: The right schedule for you to have regular tests and exams. Things you can do on your own to prevent diseases and keep yourself healthy. What should I know about diet, weight, and exercise? Eat a healthy diet  Eat a diet that includes plenty of vegetables, fruits, low-fat dairy products, and lean protein. Do not eat a lot of foods that are high in solid fats, added sugars, or sodium. Maintain a healthy weight Body mass index (BMI) is a measurement that can be used to identify possible weight problems. It estimates body fat based on height and weight. Your health care provider can help determine your BMI and help you achieve or maintain a healthy weight. Get regular exercise Get regular exercise. This is one of the most important things you can do for your health. Most adults should: Exercise for at least 150 minutes each week. The exercise should increase your heart rate and make you sweat (moderate-intensity exercise). Do strengthening exercises at least twice a week. This is in addition to the moderate-intensity exercise. Spend less time sitting. Even light physical activity can be beneficial. Watch cholesterol and blood lipids Have your blood tested for lipids and cholesterol at 46 years of age, then have this test every 5 years. You may need to have your cholesterol levels checked more often if: Your lipid or cholesterol levels are high. You are older than 46 years of age. You are at high risk for heart disease. What should I know about cancer screening? Many types of cancers can be detected early and may often be prevented. Depending on your health history and family history, you may need to have cancer screening at various ages. This may include screening for: Colorectal cancer. Prostate cancer. Skin cancer. Lung  cancer. What should I know about heart disease, diabetes, and high blood pressure? Blood pressure and heart disease High blood pressure causes heart disease and increases the risk of stroke. This is more likely to develop in people who have high blood pressure readings or are overweight. Talk with your health care provider about your target blood pressure readings. Have your blood pressure checked: Every 3-5 years if you are 18-39 years of age. Every year if you are 40 years old or older. If you are between the ages of 65 and 75 and are a current or former smoker, ask your health care provider if you should have a one-time screening for abdominal aortic aneurysm (AAA). Diabetes Have regular diabetes screenings. This checks your fasting blood sugar level. Have the screening done: Once every three years after age 45 if you are at a normal weight and have a low risk for diabetes. More often and at a younger age if you are overweight or have a high risk for diabetes. What should I know about preventing infection? Hepatitis B If you have a higher risk for hepatitis B, you should be screened for this virus. Talk with your health care provider to find out if you are at risk for hepatitis B infection. Hepatitis C Blood testing is recommended for: Everyone born from 1945 through 1965. Anyone with known risk factors for hepatitis C. Sexually transmitted infections (STIs) You should be screened each year for STIs, including gonorrhea and chlamydia, if: You are sexually active and are younger than 46 years of age. You are older than 46 years of age and your   health care provider tells you that you are at risk for this type of infection. Your sexual activity has changed since you were last screened, and you are at increased risk for chlamydia or gonorrhea. Ask your health care provider if you are at risk. Ask your health care provider about whether you are at high risk for HIV. Your health care provider  may recommend a prescription medicine to help prevent HIV infection. If you choose to take medicine to prevent HIV, you should first get tested for HIV. You should then be tested every 3 months for as long as you are taking the medicine. Follow these instructions at home: Alcohol use Do not drink alcohol if your health care provider tells you not to drink. If you drink alcohol: Limit how much you have to 0-2 drinks a day. Know how much alcohol is in your drink. In the [place] one drink equals one 12 oz bottle of beer (355 mL), one 5 oz glass of wine (148 mL), or one 1 oz glass of hard liquor (44 mL). Lifestyle Do not use any products that contain nicotine or tobacco. These products include cigarettes, chewing tobacco, and vaping devices, such as e-cigarettes. If you need help quitting, ask your health care provider. Do not use street drugs. Do not share needles. Ask your health care provider for help if you need support or information about quitting drugs. General instructions Schedule regular health, dental, and eye exams. Stay current with your vaccines. Tell your health care provider if: You often feel depressed. You have ever been abused or do not feel safe at home. Summary Adopting a healthy lifestyle and getting preventive care are important in promoting health and wellness. Follow your health care provider's instructions about healthy diet, exercising, and getting tested or screened for diseases. Follow your health care provider's instructions on monitoring your cholesterol and blood pressure. This information is not intended to replace advice given to you by your health care provider. Make sure you discuss any questions you have with your health care provider. Document Revised: 04/27/2021 Document Reviewed: 04/27/2021 Elsevier Patient Education  2023 Elsevier Inc.  

## 2023-04-22 NOTE — Patient Instructions (Signed)
It was very nice to see you today!   PLEASE NOTE:   If you had any lab tests please let us know if you have not heard back within a few days. You may see your results on MyChart before we have a chance to review them but we will give you a call once they are reviewed by us. If we ordered any referrals today, please let us know if you have not heard from their office within the next 2 weeks. You should receive a letter via MyChart confirming if the referral was approved and their office contact information to schedule. Health Maintenance, Male Adopting a healthy lifestyle and getting preventive care are important in promoting health and wellness. Ask your health care provider about: The right schedule for you to have regular tests and exams. Things you can do on your own to prevent diseases and keep yourself healthy. What should I know about diet, weight, and exercise? Eat a healthy diet  Eat a diet that includes plenty of vegetables, fruits, low-fat dairy products, and lean protein. Do not eat a lot of foods that are high in solid fats, added sugars, or sodium. Maintain a healthy weight Body mass index (BMI) is a measurement that can be used to identify possible weight problems. It estimates body fat based on height and weight. Your health care provider can help determine your BMI and help you achieve or maintain a healthy weight. Get regular exercise Get regular exercise. This is one of the most important things you can do for your health. Most adults should: Exercise for at least 150 minutes each week. The exercise should increase your heart rate and make you sweat (moderate-intensity exercise). Do strengthening exercises at least twice a week. This is in addition to the moderate-intensity exercise. Spend less time sitting. Even light physical activity can be beneficial. Watch cholesterol and blood lipids Have your blood tested for lipids and cholesterol at 47 years of age, then have this  test every 5 years. You may need to have your cholesterol levels checked more often if: Your lipid or cholesterol levels are high. You are older than 47 years of age. You are at high risk for heart disease. What should I know about cancer screening? Many types of cancers can be detected early and may often be prevented. Depending on your health history and family history, you may need to have cancer screening at various ages. This may include screening for: Colorectal cancer. Prostate cancer. Skin cancer. Lung cancer. What should I know about heart disease, diabetes, and high blood pressure? Blood pressure and heart disease High blood pressure causes heart disease and increases the risk of stroke. This is more likely to develop in people who have high blood pressure readings or are overweight. Talk with your health care provider about your target blood pressure readings. Have your blood pressure checked: Every 3-5 years if you are 18-39 years of age. Every year if you are 40 years old or older. If you are between the ages of 65 and 75 and are a current or former smoker, ask your health care provider if you should have a one-time screening for abdominal aortic aneurysm (AAA). Diabetes Have regular diabetes screenings. This checks your fasting blood sugar level. Have the screening done: Once every three years after age 45 if you are at a normal weight and have a low risk for diabetes. More often and at a younger age if you are overweight or have a high risk   for diabetes. What should I know about preventing infection? Hepatitis B If you have a higher risk for hepatitis B, you should be screened for this virus. Talk with your health care provider to find out if you are at risk for hepatitis B infection. Hepatitis C Blood testing is recommended for: Everyone born from 1945 through 1965. Anyone with known risk factors for hepatitis C. Sexually transmitted infections (STIs) You should be  screened each year for STIs, including gonorrhea and chlamydia, if: You are sexually active and are younger than 47 years of age. You are older than 47 years of age and your health care provider tells you that you are at risk for this type of infection. Your sexual activity has changed since you were last screened, and you are at increased risk for chlamydia or gonorrhea. Ask your health care provider if you are at risk. Ask your health care provider about whether you are at high risk for HIV. Your health care provider may recommend a prescription medicine to help prevent HIV infection. If you choose to take medicine to prevent HIV, you should first get tested for HIV. You should then be tested every 3 months for as long as you are taking the medicine. Follow these instructions at home: Alcohol use Do not drink alcohol if your health care provider tells you not to drink. If you drink alcohol: Limit how much you have to 0-2 drinks a day. Know how much alcohol is in your drink. In the [place] one drink equals one 12 oz bottle of beer (355 mL), one 5 oz glass of wine (148 mL), or one 1 oz glass of hard liquor (44 mL). Lifestyle Do not use any products that contain nicotine or tobacco. These products include cigarettes, chewing tobacco, and vaping devices, such as e-cigarettes. If you need help quitting, ask your health care provider. Do not use street drugs. Do not share needles. Ask your health care provider for help if you need support or information about quitting drugs. General instructions Schedule regular health, dental, and eye exams. Stay current with your vaccines. Tell your health care provider if: You often feel depressed. You have ever been abused or do not feel safe at home. Summary Adopting a healthy lifestyle and getting preventive care are important in promoting health and wellness. Follow your health care provider's instructions about healthy diet, exercising, and getting tested  or screened for diseases. Follow your health care provider's instructions on monitoring your cholesterol and blood pressure. This information is not intended to replace advice given to you by your health care provider. Make sure you discuss any questions you have with your health care provider. Document Revised: 04/27/2021 Document Reviewed: 04/27/2021 Elsevier Patient Education  2023 Elsevier Inc.  

## 2023-04-25 ENCOUNTER — Ambulatory Visit (INDEPENDENT_AMBULATORY_CARE_PROVIDER_SITE_OTHER): Payer: BC Managed Care – PPO | Admitting: Family Medicine

## 2023-04-25 ENCOUNTER — Encounter: Payer: Self-pay | Admitting: Family Medicine

## 2023-04-25 VITALS — BP 115/75 | HR 59 | Ht 69.0 in | Wt 184.6 lb

## 2023-04-25 DIAGNOSIS — Z0000 Encounter for general adult medical examination without abnormal findings: Secondary | ICD-10-CM

## 2023-04-25 DIAGNOSIS — Z1211 Encounter for screening for malignant neoplasm of colon: Secondary | ICD-10-CM | POA: Diagnosis not present

## 2023-04-25 DIAGNOSIS — E781 Pure hyperglyceridemia: Secondary | ICD-10-CM | POA: Diagnosis not present

## 2023-04-25 LAB — LDL CHOLESTEROL, DIRECT: Direct LDL: 73 mg/dL

## 2023-04-25 LAB — COMPREHENSIVE METABOLIC PANEL
ALT: 17 U/L (ref 0–53)
AST: 20 U/L (ref 0–37)
Albumin: 4.3 g/dL (ref 3.5–5.2)
Alkaline Phosphatase: 71 U/L (ref 39–117)
BUN: 17 mg/dL (ref 6–23)
CO2: 26 mEq/L (ref 19–32)
Calcium: 9.1 mg/dL (ref 8.4–10.5)
Chloride: 104 mEq/L (ref 96–112)
Creatinine, Ser: 0.9 mg/dL (ref 0.40–1.50)
GFR: 102.37 mL/min (ref >60.00)
Glucose, Bld: 85 mg/dL (ref 70–99)
Potassium: 4.2 mEq/L (ref 3.5–5.1)
Sodium: 140 mEq/L (ref 135–145)
Total Bilirubin: 0.5 mg/dL (ref 0.2–1.2)
Total Protein: 6.6 g/dL (ref 6.0–8.3)

## 2023-04-25 LAB — LIPID PANEL
Cholesterol: 238 mg/dL — ABNORMAL HIGH (ref 0–200)
HDL: 30 mg/dL — ABNORMAL LOW (ref >39.00)
Total CHOL/HDL Ratio: 8
Triglycerides: 1258 mg/dL — ABNORMAL HIGH (ref 0.0–149.0)

## 2023-04-25 LAB — CBC WITH DIFFERENTIAL/PLATELET
Basophils Absolute: 0.1 10*3/uL (ref 0.0–0.1)
Basophils Relative: 0.7 % (ref 0.0–3.0)
Eosinophils Absolute: 0.3 10*3/uL (ref 0.0–0.7)
Eosinophils Relative: 3 % (ref 0.0–5.0)
HCT: 42.9 % (ref 39.0–52.0)
Hemoglobin: 14.9 g/dL (ref 13.0–17.0)
Lymphocytes Relative: 24.4 % (ref 12.0–46.0)
Lymphs Abs: 2.3 10*3/uL (ref 0.7–4.0)
MCHC: 34.9 g/dL (ref 30.0–36.0)
MCV: 90.9 fl (ref 78.0–100.0)
Monocytes Absolute: 0.6 10*3/uL (ref 0.1–1.0)
Monocytes Relative: 5.9 % (ref 3.0–12.0)
Neutro Abs: 6.2 10*3/uL (ref 1.4–7.7)
Neutrophils Relative %: 66 % (ref 43.0–77.0)
Platelets: 182 10*3/uL (ref 150.0–400.0)
RBC: 4.72 Mil/uL (ref 4.22–5.81)
RDW: 12.1 % (ref 11.5–15.5)
WBC: 9.4 10*3/uL (ref 4.0–10.5)

## 2023-04-25 LAB — TSH: TSH: 1.66 u[IU]/mL (ref 0.35–5.50)

## 2023-04-25 NOTE — Progress Notes (Signed)
Office Note 04/25/2023  CC:  Chief Complaint  Patient presents with   Annual Exam    Fasting CPE.    HPI:  Patient is a 47 y.o. male who is here for annual health maintenance exam. He is taking great care of himself.  He exercises every day. He is eating 2 meals a day and healthy food choices.  Past Medical History:  Diagnosis Date   Abnormal EKG 02/08/2020   while asymptomatic->Q waves in III and aVF--"possible IWMI".  Echo normal wall motion.   Atypical chest pain    cards 2021->coronary calcium zero, echo mild LVH, normal wall motion.   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   Kidney stones    bladder stone had to be retrieved (unable to get old records-->pt w/out clear idea where he got this done... "in [facility]"..HP urol says not their pt ever).  L Ureteral stone 10/2020.   Plantar fasciitis, bilateral    steroid inj by podiatry 2020 helpful   Psoriasis 2021   Hands primarily (Dr. Jorja Loa)   Tobacco dependence     Past Surgical History:  Procedure Laterality Date   CT CORONARY CA SCORING  02/27/2020   ZERO   NEPHROLITHOTOMY  02/22/2022   TRANSTHORACIC ECHOCARDIOGRAM  02/27/2020   EF 60-65%, mild LVH, normal wall motion, normal valves    Family History  Problem Relation Age of Onset   Alzheimer's disease Father    Parkinson's disease Father    Heart attack Maternal Grandmother    Lung disease Maternal Grandfather    Stroke Paternal Grandfather 71   Cancer Neg Hx    Diabetes Neg Hx     Social History   Socioeconomic History   Marital status: Married    Spouse name: Not on file   Number of children: Not on file   Years of education: Not on file   Highest education level: Not on file  Occupational History   Not on file  Tobacco Use    Smoking status: Every Day    Packs/day: 0.25    Years: 15.00    Additional pack years: 0.00    Total pack years: 3.75    Types: Cigarettes   Smokeless tobacco: Never   Tobacco comments:    has bought patches  Vaping Use   Vaping Use: Never used  Substance and Sexual Activity   Alcohol use: No   Drug use: No   Sexual activity: Not on file  Other Topics Concern   Not on file  Social History Narrative   Married, 1 son and 1 daughter.   Orig from [place].  Relocated to [place] 2000, got Ph.D in finance at [employer].     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   Tob: 1 pack q 3 d: approx 10 pack-yr hx.     No alc.   Social Determinants of Health   Financial Resource Strain: Not on file  Food Insecurity: Not on file  Transportation Needs: Not on file  Physical Activity: Not on file  Stress: Not on file  Social Connections: Not on file  Intimate Partner Violence: Not on file    Outpatient Medications Prior to Visit  Medication Sig Dispense Refill   halobetasol (ULTRAVATE) 0.05 %  cream APPLY TO AFFECTED AREA EVERY DAY FOR 14 DAYS 15 g 2   No facility-administered medications prior to visit.    Allergies  Allergen Reactions   Methylprednisolone Itching    Review of Systems  Constitutional:  Negative for appetite change, chills, fatigue and fever.  HENT:  Negative for congestion, dental problem, ear pain and sore throat.   Eyes:  Negative for discharge, redness and visual disturbance.  Respiratory:  Negative for cough, chest tightness, shortness of breath and wheezing.   Cardiovascular:  Negative for chest pain, palpitations and leg swelling.  Gastrointestinal:  Negative for abdominal pain, blood in stool, diarrhea, nausea and vomiting.  Genitourinary:  Negative for difficulty urinating, dysuria, flank pain, frequency, hematuria and urgency.  Musculoskeletal:  Negative for arthralgias, back pain, joint swelling, myalgias and neck stiffness.  Skin:  Negative for pallor and rash.   Neurological:  Negative for dizziness, speech difficulty, weakness and headaches.  Hematological:  Negative for adenopathy. Does not bruise/bleed easily.  Psychiatric/Behavioral:  Negative for confusion and sleep disturbance. The patient is not nervous/anxious.     PE;    04/25/2023   10:30 AM 04/22/2022   10:41 AM 04/22/2021    9:03 AM  Vitals with BMI  Height 5\' 9"  5\' 9"  5\' 9"   Weight 184 lbs 10 oz 181 lbs 199 lbs 3 oz  BMI 27.25 26.72 29.4  Systolic 115 95 103  Diastolic 75 59 69  Pulse 59 60 53   Gen: Alert, well appearing.  Patient is oriented to person, place, time, and situation. AFFECT: pleasant, lucid thought and speech. ENT: Ears: EACs clear, normal epithelium.  TMs with good light reflex and landmarks bilaterally.  Eyes: no injection, icteris, swelling, or exudate.  EOMI, PERRLA. Nose: no drainage or turbinate edema/swelling.  No injection or focal lesion.  Mouth: lips without lesion/swelling.  Oral mucosa pink and moist.  Dentition intact and without obvious caries or gingival swelling.  Oropharynx without erythema, exudate, or swelling.  Neck: supple/nontender.  No LAD, mass, or TM.  Carotid pulses 2+ bilaterally, without bruits. CV: RRR, no m/r/g.   LUNGS: CTA bilat, nonlabored resps, good aeration in all lung fields. ABD: soft, NT, ND, BS normal.  No hepatospenomegaly or mass.  No bruits. EXT: no clubbing, cyanosis, or edema.  Musculoskeletal: no joint swelling, erythema, warmth, or tenderness.  ROM of all joints intact. Skin - no sores or suspicious lesions or rashes or color changes  Pertinent labs:  Lab Results  Component Value Date   TSH 1.89 04/22/2022   Lab Results  Component Value Date   WBC 5.4 04/22/2022   HGB 14.8 04/22/2022   HCT 44.3 04/22/2022   MCV 91.5 04/22/2022   PLT 187.0 04/22/2022   Lab Results  Component Value Date   CREATININE 0.88 04/22/2022   BUN 19 04/22/2022   NA 140 04/22/2022   K 4.2 04/22/2022   CL 105 04/22/2022   CO2 26  04/22/2022   Lab Results  Component Value Date   ALT 20 04/22/2022   AST 17 04/22/2022   ALKPHOS 62 04/22/2022   BILITOT 1.0 04/22/2022   Lab Results  Component Value Date   CHOL 223 (H) 04/22/2022   Lab Results  Component Value Date   HDL 39.20 04/22/2022   Lab Results  Component Value Date   LDLCALC 118 10/18/2016   Lab Results  Component Value Date   TRIG (H) 04/22/2022    401.0 Triglyceride is over 400; calculations on Lipids are  invalid.   Lab Results  Component Value Date   CHOLHDL 6 04/22/2022   ASSESSMENT AND PLAN:   Health maintenance exam: Reviewed age and gender appropriate health maintenance issues (prudent diet, regular exercise, health risks of tobacco and excessive alcohol, use of seatbelts, fire alarms in home, use of sunscreen).  Also reviewed age and gender appropriate health screening as well as vaccine recommendations. Vaccines: all UTD. Labs: fasting HP ordered Prostate ca screening: average risk patient= as per latest guidelines, start screening at 5 yrs of age. Colon ca screening: due for initial colon cancer screening.  Options discussed today-->referred to GI for colonoscopy.  An After Visit Summary was printed and given to the patient.  FOLLOW UP:  Return in about 1 year (around 04/24/2024) for annual CPE (fasting).  Signed:  Santiago Bumpers, MD           04/25/2023

## 2023-04-29 ENCOUNTER — Other Ambulatory Visit: Payer: Self-pay | Admitting: Family Medicine

## 2023-04-29 MED ORDER — GEMFIBROZIL 600 MG PO TABS
600.0000 mg | ORAL_TABLET | Freq: Two times a day (BID) | ORAL | 1 refills | Status: DC
Start: 2023-04-29 — End: 2024-03-23

## 2023-05-01 ENCOUNTER — Other Ambulatory Visit: Payer: Self-pay | Admitting: Family Medicine

## 2023-05-02 ENCOUNTER — Other Ambulatory Visit: Payer: Self-pay

## 2023-05-02 MED ORDER — HALOBETASOL PROPIONATE 0.05 % EX CREA: TOPICAL_CREAM | CUTANEOUS | 2 refills | Status: DC

## 2023-05-02 NOTE — Telephone Encounter (Signed)
LVM for pt to CB confirming if refill needed

## 2023-05-23 ENCOUNTER — Other Ambulatory Visit: Payer: Self-pay | Admitting: Family Medicine

## 2023-10-14 ENCOUNTER — Other Ambulatory Visit: Payer: Self-pay

## 2023-10-14 MED ORDER — HALOBETASOL PROPIONATE 0.05 % EX CREA: TOPICAL_CREAM | CUTANEOUS | 2 refills | Status: DC

## 2023-10-14 NOTE — Telephone Encounter (Signed)
Patient walked in office to request refill on medication.  Prescription Request  10/14/2023  LOV: Visit date not found  What is the name of the medication or equipment?      halobetasol (ULTRAVATE) 0.05 % cream    Have you contacted your pharmacy to request a refill? No   Which pharmacy would you like this sent to?  [id number] - [place], [place] - [address] [place] [address] Phone: [phone] Fax: [phone]    Patient notified that their request is being sent to the clinical staff for review and that they should receive a response within 2 business days.   Please advise at Mobile [phone] (mobile)

## 2023-10-14 NOTE — Telephone Encounter (Signed)
LOV: 04/25/23 Next ov: 04/25/24 Last written: 05/02/23 (15g, 2)   Please fill, if appropriate.

## 2024-03-23 ENCOUNTER — Other Ambulatory Visit: Payer: Self-pay | Admitting: Family Medicine

## 2024-04-17 ENCOUNTER — Other Ambulatory Visit: Payer: Self-pay | Admitting: Family Medicine

## 2024-04-25 ENCOUNTER — Ambulatory Visit: Payer: BC Managed Care – PPO | Admitting: Family Medicine

## 2024-04-25 ENCOUNTER — Encounter: Payer: Self-pay | Admitting: Family Medicine

## 2024-04-25 VITALS — BP 97/61 | HR 51 | Temp 98.3°F | Ht 69.0 in | Wt 185.6 lb

## 2024-04-25 DIAGNOSIS — E781 Pure hyperglyceridemia: Secondary | ICD-10-CM | POA: Diagnosis not present

## 2024-04-25 DIAGNOSIS — Z0000 Encounter for general adult medical examination without abnormal findings: Secondary | ICD-10-CM | POA: Diagnosis not present

## 2024-04-25 MED ORDER — HALOBETASOL PROPIONATE 0.05 % EX CREA: TOPICAL_CREAM | CUTANEOUS | 2 refills | Status: DC

## 2024-04-25 NOTE — Progress Notes (Signed)
 Office Note 04/25/2024  CC:  Chief Complaint  Patient presents with   Annual Exam    Pt is fasting    HPI:  Patient is a 48 y.o. male who is here for annual health maintenance exam and 1 yr f/u hypertriglyceridemia.  He feels well. He runs a 5K 2 days a week and swims 2 days a week. He admits he eats too much but is trying to eat healthier types of foods.  He did not fill the gemfibrozil  rx because he wanted to work on diet and exercise prior to starting medication.  Past Medical History:  Diagnosis Date   Abnormal EKG 02/08/2020   while asymptomatic->Q waves in III and aVF--"possible IWMI".  Echo normal wall motion.   Atypical chest pain    cards 2021->coronary calcium zero, echo mild LVH, normal wall motion.   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   Kidney stones    bladder stone had to be retrieved (unable to get old records-->pt w/out clear idea where he got this done... "in [facility]"..[facility] urol says not their pt ever).  L Ureteral stone 10/2020.   Plantar fasciitis, bilateral    steroid inj by podiatry 2020 helpful   Psoriasis 2021   Hands primarily (Dr. Steen Eden)   Tobacco dependence     Past Surgical History:  Procedure Laterality Date   CT CORONARY CA SCORING  02/27/2020   ZERO   NEPHROLITHOTOMY  02/22/2022   TRANSTHORACIC ECHOCARDIOGRAM  02/27/2020   EF 60-65%, mild LVH, normal wall motion, normal valves    Family History  Problem Relation Age of Onset   Alzheimer's disease Father    Parkinson's disease Father    Heart attack Maternal Grandmother    Lung disease Maternal Grandfather    Stroke Paternal Grandfather 20   Cancer Neg Hx    Diabetes Neg Hx     Social History   Socioeconomic History   Marital status: Married    Spouse  name: Not on file   Number of children: Not on file   Years of education: Not on file   Highest education level: Not on file  Occupational History   Not on file  Tobacco Use   Smoking status: Every Day    Current packs/day: 0.25    Average packs/day: 0.3 packs/day for 15.0 years (3.8 ttl pk-yrs)    Types: Cigarettes   Smokeless tobacco: Never   Tobacco comments:    has bought patches  Vaping Use   Vaping status: Never Used  Substance and Sexual Activity   Alcohol use: No   Drug use: No   Sexual activity: Not on file  Other Topics Concern   Not on file  Social History Narrative   Married, 1 son and 1 daughter.   Orig from [place].  Relocated to [place]  2000, got Ph.D in finance at [employer].     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   Tob: 1 pack q 3 d: approx 10 pack-yr hx.     No alc.   Social Drivers of Corporate investment banker Strain: Not on file  Food Insecurity: Not on file  Transportation Needs: Not on file  Physical Activity: Not on file  Stress: Not on  file  Social Connections: Not on file  Intimate Partner Violence: Not on file    Outpatient Medications Prior to Visit  Medication Sig Dispense Refill   halobetasol  (ULTRAVATE ) 0.05 % cream APPLY TO AFFECTED AREA EVERY DAY FOR 14 DAYS 15 g 2   gemfibrozil  (LOPID ) 600 MG tablet TAKE 1 TABLET BY MOUTH TWICE A DAY BEFORE A MEAL (Patient not taking: Reported on 04/25/2024) 60 tablet 0   No facility-administered medications prior to visit.    Allergies  Allergen Reactions   Methylprednisolone  Itching    Review of Systems  Constitutional:  Negative for appetite change, chills, fatigue and fever.  HENT:  Negative for congestion, dental problem, ear pain and sore throat.   Eyes:  Negative for discharge, redness and visual disturbance.  Respiratory:  Negative for cough, chest tightness, shortness of breath and wheezing.   Cardiovascular:  Negative for chest pain, palpitations and leg swelling.  Gastrointestinal:  Negative for  abdominal pain, blood in stool, diarrhea, nausea and vomiting.  Genitourinary:  Negative for difficulty urinating, dysuria, flank pain, frequency, hematuria and urgency.  Musculoskeletal:  Negative for arthralgias, back pain, joint swelling, myalgias and neck stiffness.  Skin:  Negative for pallor and rash.  Neurological:  Negative for dizziness, speech difficulty, weakness and headaches.  Hematological:  Negative for adenopathy. Does not bruise/bleed easily.  Psychiatric/Behavioral:  Negative for confusion and sleep disturbance. The patient is not nervous/anxious.     PE;    04/25/2024   10:43 AM 04/25/2023   10:30 AM 04/22/2022   10:41 AM  Vitals with BMI  Height 5\' 9"  5\' 9"  5\' 9"   Weight 185 lbs 10 oz 184 lbs 10 oz 181 lbs  BMI 27.4 27.25 26.72  Systolic 97 115 95  Diastolic 61 75 59  Pulse 51 59 60   Gen: Alert, well appearing.  Patient is oriented to person, place, time, and situation. AFFECT: pleasant, lucid thought and speech. ENT: Ears: EACs clear, normal epithelium.  TMs with good light reflex and landmarks bilaterally.  Eyes: no injection, icteris, swelling, or exudate.  EOMI, PERRLA. Nose: no drainage or turbinate edema/swelling.  No injection or focal lesion.  Mouth: lips without lesion/swelling.  Oral mucosa pink and moist.  Dentition intact and without obvious caries or gingival swelling.  Oropharynx without erythema, exudate, or swelling.  Neck: supple/nontender.  No LAD, mass, or TM.  Carotid pulses 2+ bilaterally, without bruits. CV: RRR, no m/r/g.   LUNGS: CTA bilat, nonlabored resps, good aeration in all lung fields. ABD: soft, NT, ND, BS normal.  No hepatospenomegaly or mass.  No bruits. EXT: no clubbing, cyanosis, or edema.  Musculoskeletal: no joint swelling, erythema, warmth, or tenderness.  ROM of all joints intact. Skin - no sores or suspicious lesions or rashes or color changes  Pertinent labs:  Lab Results  Component Value Date   TSH 1.66 04/25/2023   Lab  Results  Component Value Date   WBC 9.4 04/25/2023   HGB 14.9 04/25/2023   HCT 42.9 04/25/2023   MCV 90.9 04/25/2023   PLT 182.0 04/25/2023   Lab Results  Component Value Date   CREATININE 0.90 04/25/2023   BUN 17 04/25/2023   NA 140 04/25/2023   K 4.2 04/25/2023   CL 104 04/25/2023   CO2 26 04/25/2023   Lab Results  Component Value Date   ALT 17 04/25/2023   AST 20 04/25/2023   ALKPHOS 71 04/25/2023   BILITOT 0.5 04/25/2023   Lab Results  Component  Value Date   CHOL 238 (H) 04/25/2023   Lab Results  Component Value Date   HDL 30.00 (L) 04/25/2023   Lab Results  Component Value Date   LDLCALC 118 10/18/2016   Lab Results  Component Value Date   TRIG (H) 04/25/2023    1258.0 Triglyceride is over 400; calculations on Lipids are invalid.   Lab Results  Component Value Date   CHOLHDL 8 04/25/2023   ASSESSMENT AND PLAN:   #1 Health maintenance exam: Reviewed age and gender appropriate health maintenance issues (prudent diet, regular exercise, health risks of tobacco and excessive alcohol, use of seatbelts, fire alarms in home, use of sunscreen).  Also reviewed age and gender appropriate health screening as well as vaccine recommendations. Vaccines: He declines Prevnar 20.  Otherwise all UTD. Labs: fasting [facility] ordered Prostate ca screening: average risk patient= as per latest guidelines, start screening at 31 yrs of age. Colon ca screening: due for initial colon cancer screening.  Options discussed today--> he prefers to defer until age 82.  #2 hypertriglyceridemia. Has been working on diet and exercise. Recheck today. He said he is open to gemfibrozil  if triglycerides remain significantly elevated.  An After Visit Summary was printed and given to the patient.  FOLLOW UP:  Return in about 1 year (around 04/25/2025) for annual CPE (fasting).  Signed:  Arletha Lady, MD           04/25/2024

## 2024-04-26 LAB — LIPID PANEL
Cholesterol: 246 mg/dL — ABNORMAL HIGH (ref <200)
HDL: 49 mg/dL (ref >=40)
LDL Cholesterol (Calc): 160 mg/dL — ABNORMAL HIGH
Non-HDL Cholesterol (Calc): 197 mg/dL — ABNORMAL HIGH (ref <130)
Total CHOL/HDL Ratio: 5 (calc) — ABNORMAL HIGH (ref <5.0)
Triglycerides: 208 mg/dL — ABNORMAL HIGH (ref <150)

## 2024-04-26 LAB — CBC WITH DIFFERENTIAL/PLATELET
Absolute Lymphocytes: 2318 {cells}/uL (ref 850–3900)
Absolute Monocytes: 491 {cells}/uL (ref 200–950)
Basophils Absolute: 50 {cells}/uL (ref 0–200)
Basophils Relative: 0.8 %
Eosinophils Absolute: 403 {cells}/uL (ref 15–500)
Eosinophils Relative: 6.4 %
HCT: 46.1 % (ref 38.5–50.0)
Hemoglobin: 15.8 g/dL (ref 13.2–17.1)
MCH: 30.9 pg (ref 27.0–33.0)
MCHC: 34.3 g/dL (ref 32.0–36.0)
MCV: 90.2 fL (ref 80.0–100.0)
MPV: 10.5 fL (ref 7.5–12.5)
Monocytes Relative: 7.8 %
Neutro Abs: 3037 {cells}/uL (ref 1500–7800)
Neutrophils Relative %: 48.2 %
Platelets: 201 10*3/uL (ref 140–400)
RBC: 5.11 10*6/uL (ref 4.20–5.80)
RDW: 12.2 % (ref 11.0–15.0)
Total Lymphocyte: 36.8 %
WBC: 6.3 10*3/uL (ref 3.8–10.8)

## 2024-04-26 LAB — COMPREHENSIVE METABOLIC PANEL WITH GFR
AG Ratio: 1.8 (calc) (ref 1.0–2.5)
ALT: 14 U/L (ref 9–46)
AST: 16 U/L (ref 10–40)
Albumin: 4.6 g/dL (ref 3.6–5.1)
Alkaline phosphatase (APISO): 57 U/L (ref 36–130)
BUN: 15 mg/dL (ref 7–25)
CO2: 25 mmol/L (ref 20–32)
Calcium: 9.8 mg/dL (ref 8.6–10.3)
Chloride: 107 mmol/L (ref 98–110)
Creat: 0.99 mg/dL (ref 0.60–1.29)
Globulin: 2.6 g/dL (ref 1.9–3.7)
Glucose, Bld: 84 mg/dL (ref 65–99)
Potassium: 4.4 mmol/L (ref 3.5–5.3)
Sodium: 143 mmol/L (ref 135–146)
Total Bilirubin: 1.2 mg/dL (ref 0.2–1.2)
Total Protein: 7.2 g/dL (ref 6.1–8.1)
eGFR: 95 mL/min/{1.73_m2} (ref >=60)

## 2024-04-26 LAB — TSH: TSH: 1.54 m[IU]/L (ref 0.40–4.50)

## 2024-05-03 ENCOUNTER — Ambulatory Visit: Payer: Self-pay | Admitting: Family Medicine

## 2024-05-03 ENCOUNTER — Encounter: Payer: Self-pay | Admitting: Family Medicine

## 2024-07-12 ENCOUNTER — Ambulatory Visit (INDEPENDENT_AMBULATORY_CARE_PROVIDER_SITE_OTHER)

## 2024-07-12 ENCOUNTER — Encounter: Payer: Self-pay | Admitting: Podiatry

## 2024-07-12 ENCOUNTER — Ambulatory Visit: Payer: Self-pay | Admitting: Podiatry

## 2024-07-12 DIAGNOSIS — M7752 Other enthesopathy of left foot: Secondary | ICD-10-CM

## 2024-07-12 DIAGNOSIS — M722 Plantar fascial fibromatosis: Secondary | ICD-10-CM | POA: Diagnosis not present

## 2024-07-12 DIAGNOSIS — M7750 Other enthesopathy of unspecified foot: Secondary | ICD-10-CM

## 2024-07-12 DIAGNOSIS — M7672 Peroneal tendinitis, left leg: Secondary | ICD-10-CM

## 2024-07-12 MED ORDER — TRIAMCINOLONE ACETONIDE 40 MG/ML IJ SUSP
20.0000 mg | Freq: Once | INTRAMUSCULAR | Status: AC
  Administered 2024-07-12: 20 mg

## 2024-07-12 MED ORDER — MELOXICAM 15 MG PO TABS: 15.0000 mg | ORAL_TABLET | Freq: Every day | ORAL | 3 refills | Status: AC

## 2024-07-15 NOTE — Progress Notes (Signed)
 Subjective:  Patient ID: Gene Burke, male    DOB: [birth year],  MRN: [id number] HPI Chief Complaint  Patient presents with   Foot Injury    Plantar heel/arch/medial ankle left - injury 6 months ago, twisted ankle, active runner, thinks never let injury fully heal, now having more pain and swelling in heel, ankle still swells and occasional pain, wears supportive shoes   New Patient (Initial Visit)    Est pt 2020    48 y.o. male presents with the above complaint.   ROS: Denies fever chills nausea muscle aches pains calf pain back pain chest pain shortness of breath.  Past Medical History:  Diagnosis Date   Abnormal EKG 02/08/2020   while asymptomatic->Q waves in III and aVF--possible IWMI.  Echo normal wall motion.   Atypical chest pain    cards 2021->coronary calcium zero, echo mild LVH, normal wall motion.   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   Kidney stones    bladder stone had to be retrieved (unable to get old records-->pt w/out clear idea where he got this done... in [facility]..[facility] urol says not their pt ever).  L Ureteral stone 10/2020.   Mixed hyperlipidemia    trigs>1000 at one point   Plantar fasciitis, bilateral    steroid inj by podiatry 2020 helpful   Psoriasis 2021   Hands primarily (Dr. Livingston)   Tobacco dependence    Past Surgical History:  Procedure Laterality Date   CT CORONARY CA SCORING  02/27/2020   ZERO   NEPHROLITHOTOMY  02/22/2022   TRANSTHORACIC ECHOCARDIOGRAM  02/27/2020   EF 60-65%, mild LVH, normal wall motion, normal valves    Current Outpatient Medications:    meloxicam  (MOBIC ) 15 MG tablet, Take 1 tablet (15 mg total) by mouth daily., Disp: 30 tablet, Rfl: 3  Allergies  Allergen Reactions   Methylprednisolone  Itching   Review of  Systems Objective:  There were no vitals filed for this visit.  General: Well developed, nourished, in no acute distress, alert and oriented x3   Dermatological: Skin is warm, dry and supple bilateral. Nails x 10 are well maintained; remaining integument appears unremarkable at this time. There are no open sores, no preulcerative lesions, no rash or signs of infection present.  Vascular: Dorsalis Pedis artery and Posterior Tibial artery pedal pulses are 2/4 bilateral with immedate capillary fill time. Pedal hair growth present. No varicosities and no lower extremity edema present bilateral.   Neruologic: Grossly intact via light touch bilateral. Vibratory intact via tuning fork bilateral. Protective threshold with Semmes Wienstein monofilament intact to all pedal sites bilateral. Patellar and Achilles deep tendon reflexes 2+ bilateral. No Babinski or clonus noted bilateral.   Musculoskeletal: No gross boney pedal deformities bilateral. No pain, crepitus, or limitation noted with foot and ankle range of motion bilateral. Muscular strength 5/5 in all groups tested bilateral.  Pain on palpation of the plantar fascia and of the peroneal tendons.  No fluctuance.  He does have pain on palpation of the anterior talofibular ligament  Gait: Unassisted, Nonantalgic.    Radiographs:  Radiographs do not demonstrate any type of osseous abnormalities of the left ankle there is soft tissue increase in density plantar fascial canny insertion site is noted.    Assessment & Plan:  Assessment: Anterior talofibular ligament sprain peroneal tendon strain with tendinitis and plantar fasciitis.    Plan: Discussed etiology pathology conservative surgical therapies at this point I injected the left heel placed him in a cam boot started him on meloxicam  and placed him in an anklet.  Discussed icing I would like to follow-up with him in 1 month may need to consider MRI at that time     Alexias Margerum T. [place], [place]

## 2024-08-13 ENCOUNTER — Ambulatory Visit: Admitting: Podiatry

## 2024-08-13 DIAGNOSIS — M722 Plantar fascial fibromatosis: Secondary | ICD-10-CM | POA: Diagnosis not present

## 2024-08-13 DIAGNOSIS — M7672 Peroneal tendinitis, left leg: Secondary | ICD-10-CM

## 2024-08-13 DIAGNOSIS — M7752 Other enthesopathy of left foot: Secondary | ICD-10-CM | POA: Diagnosis not present

## 2024-08-13 NOTE — Progress Notes (Unsigned)
  Subjective:  Patient ID: Rondal Vandevelde, male    DOB: 12/01/1976,  MRN: [id number] HPI Chief Complaint  Patient presents with   Plantar Fasciitis    Left foot plantar fasciitis follow up  Pt stated that he saw Dr Verta back in July for this and he is here for a follow up he stated that it has improved some but still has some slight discomfort      48 y.o. male presents with the above complaint.  States he is doing a lot of walking while in [place]  Patient was inside and moved to the bottom.  Overall he is feeling better and having some discomfort but not any significant pain.     Review of Systems Objective:  There were no vitals filed for this visit.  General: Well developed, nourished, in no acute distress, alert and oriented x3   Dermatological: Skin is warm, dry and supple bilateral. There are no open sores, no preulcerative lesions, no rash or signs of infection present.  Vascular: Dorsalis Pedis artery and Posterior Tibial artery pedal pulses are 2/4 bilateral with immedate capillary fill time. Pedal hair growth present. No varicosities and no lower extremity edema present bilateral.   Neruologic: Grossly intact via light touch bilateral.   Musculoskeletal: Not able to appreciate any area pinpoint tenderness.  There is no significant discomfort of course the peroneal or the plantar fascia today.  Is no area pinpoint tenderness.  No significant pain on the ATFL today.  MMT 5/5.  Gait: Unassisted, Nonantalgic.    Radiographs:     Assessment & Plan:   Assessment: Anterior talofibular ligament sprain peroneal tendon strain with tendinitis and plantar fasciitis.    Plan: Overall he seems to doing better and symptoms are improving.  Brought up on any injections today as he is not in any pain.  At this time we discussed continued strengthening, rehab exercises at home as well as continuing shoes, good arch support.  Donnice JONELLE Fees DPM

## 2024-08-14 ENCOUNTER — Ambulatory Visit: Admitting: Podiatry

## 2024-11-30 ENCOUNTER — Ambulatory Visit: Payer: Self-pay | Admitting: *Deleted

## 2024-11-30 ENCOUNTER — Ambulatory Visit
Admission: EM | Admit: 2024-11-30 | Discharge: 2024-11-30 | Disposition: A | Discharge status: Home / Self Care | Attending: Physician Assistant | Admitting: Physician Assistant

## 2024-11-30 DIAGNOSIS — S81852A Open bite, left lower leg, initial encounter: Secondary | ICD-10-CM

## 2024-11-30 DIAGNOSIS — L309 Dermatitis, unspecified: Secondary | ICD-10-CM

## 2024-11-30 MED ORDER — TETANUS-DIPHTH-ACELL PERTUSSIS 5-2-15.5 LF-MCG/0.5 IM SUSP
0.5000 mL | Freq: Once | INTRAMUSCULAR | Status: AC
  Administered 2024-11-30: 0.5 mL via INTRAMUSCULAR

## 2024-11-30 MED ORDER — AMOXICILLIN-POT CLAVULANATE 875-125 MG PO TABS: 1.0000 | ORAL_TABLET | Freq: Two times a day (BID) | ORAL | 0 refills | Status: AC

## 2024-11-30 MED ORDER — DESONIDE 0.05 % EX CREA: TOPICAL_CREAM | Freq: Two times a day (BID) | CUTANEOUS | 0 refills | Status: AC

## 2024-11-30 NOTE — Discharge Instructions (Addendum)
 VISIT SUMMARY:  You came in today because of a dog bite on your leg that happened yesterday. We also discussed your ongoing eczema condition.  YOUR PLAN:  -DOG BITE WOUND TO LEG: You have a puncture wound on your leg from a dog bite. Since the dog is vaccinated, rabies is not a concern, but there is a risk of bacterial infection. You have been prescribed antibiotics to prevent infection and received a tetanus booster. Keep the wound clean and dry using warm water and gentle soap. Watch for signs of infection like redness, swelling, pus, or difficulty moving your leg and foot, and seek medical attention if these occur.  -ECZEMA: Eczema is a chronic skin condition that causes dry, itchy, and raised patches on the skin. It can be triggered by certain foods and weight changes. You have been prescribed a steroid cream to apply to the affected areas twice daily for no more than two weeks. Additionally, use thick hydrating ointments like Aquaphor or Vaseline to keep your skin moisturized.  -GENERAL HEALTH MAINTENANCE: We discussed routine health maintenance, including the importance of the tetanus booster you received today following the dog bite.  INSTRUCTIONS:  Please follow up if you notice any signs of infection in the dog bite wound, such as redness, swelling, pus, or difficulty moving your leg and foot. Continue using the prescribed steroid cream for your eczema as directed, and keep your skin moisturized with thick ointments. If your symptoms persist or worsen, schedule a follow-up appointment.

## 2024-11-30 NOTE — ED Triage Notes (Signed)
 Pt c/o injury to lower left leg since yesterday when his neighbors  dog came out and bit him in back of leg. Neighbor states dog is up to date on rabies. Last tdap was in 2018

## 2024-11-30 NOTE — Telephone Encounter (Signed)
 No further action needed pt advised by triage to go to urgent care

## 2024-11-30 NOTE — Telephone Encounter (Signed)
 FYI Only or Action Required?: FYI only for provider: no open appointment- UC advised .  Patient was last seen in primary care on 04/25/2024 by McGowen, Aleene DEL, MD.  Called Nurse Triage reporting No chief complaint on file..  Symptoms began yesterday.  Interventions attempted: Other: cleaned soap/water, bandage.  Symptoms are: stable.  Triage Disposition: See HCP Within 4 Hours (Or PCP Triage)  Patient/caregiver understands and will follow disposition?: Yes   Copied from CRM #[id number]. Topic: Clinical - Red Word Triage >> Nov 30, 2024  9:01 AM Mercedes MATSU wrote: Red Word that prompted transfer to Nurse Triage: Patient was bit by a small dog. Bite site is painful and bleeding, dog is up to date on shots. Reason for Disposition  Looks infected (spreading redness, red streak, pus)  Answer Assessment - Initial Assessment Questions 1. APPEARANCE What does it look like?  (e.g., abrasion, bruise, puncture)      punture 2. SIZE: How big is the bite? (e.g., inches, cm; or compare to size of coin, pea, grape, ping pong ball)      1 puncture 3. LOCATION: Where is the bite located?      Left lower leg 4. ONSET: When did the bite happen? (e.g., minutes, hours ago)      Yesterday evening 5. ANIMAL: What type of animal caused the bite? Is the injury from a bite or a claw? If the animal is a dog or a cat, ask: Was it a pet or a stray? Was it acting ill or behaving strangely?     Dog, pet, vaccines current 6. RABIES VACCINE: For dog or cat bites, ask: Do you know if the pet is vaccinated against rabies?  (e.g., yes, no, overdue for rabies shot, unknown)     Neighbors pet- vaccines update 7. CIRCUMSTANCES: Tell me how this happened.      Visiting the neighbor- dog bit at the door 8. TETANUS: When was your last tetanus booster?     unsure  Protocols used: Animal Bite-A-AH

## 2024-11-30 NOTE — ED Provider Notes (Signed)
 [facility]    CSN: [id number] Arrival date & time: 11/30/24  1030      History   Chief Complaint Chief Complaint  Patient presents with   Animal Bite    Neighbors dog    HPI Davinci Glotfelty is a 48 y.o. male.  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   HPI  Discussed the use of AI scribe software for clinical note transcription with the patient, who gave verbal consent to proceed.  The patient presents with a dog bite on his leg.  He was bitten by a neighbor's small dog yesterday at 8 PM, resulting in a puncture wound on the leg. The dog is up to date on its vaccinations, including rabies. His spouse rinsed the wound and applied pressure, which may have worsened its appearance. He is concerned about the risk of infection due to the puncture wound.  He has a history of eczema for about ten years, with lesions on various parts of his body. The eczema flares up depending on dietary changes and weight fluctuations. He uses creams prescribed by a dermatologist to manage the condition, which sometimes improves with weight loss. The eczema is described as itchy.    Past Medical History:  Diagnosis Date   Abnormal EKG 02/08/2020   while asymptomatic->Q waves in III and aVF--possible IWMI.  Echo normal wall motion.   Atypical chest pain    cards 2021->coronary calcium zero, echo mild LVH, normal wall motion.   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   Kidney stones    bladder stone had to be retrieved (unable to get old  records-->pt w/out clear idea where he got this done... in [facility]HP urol says not their pt ever).  L Ureteral stone 10/2020.   Mixed hyperlipidemia    trigs>1000 at one point   Plantar fasciitis, bilateral    steroid inj by podiatry 2020 helpful   Psoriasis 2021   Hands primarily (Dr. 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 Authorizing Provider  amoxicillin-clavulanate (AUGMENTIN) 875-125 MG tablet Take 1 tablet by mouth every 12 (twelve) hours. 11/30/24  Yes Zyniah Ferraiolo E, PA-C  desonide (DESOWEN) 0.05 % cream Apply topically 2 (two) times daily. 11/30/24  Yes Jenesa Foresta E, PA-C  meloxicam  (MOBIC ) 15 MG tablet Take 1 tablet (15 mg total) by mouth daily. 07/12/24   Verta Royden DASEN, DPM    Family History Family History  Problem  Relation Age of Onset   Alzheimer's disease Father    Parkinson's disease Father    Heart attack Maternal Grandmother    Lung disease Maternal Grandfather    Stroke Paternal Grandfather 35   Cancer Neg Hx    Diabetes Neg Hx     Social History Social History[1]   Allergies   Methylprednisolone    Review of Systems Review of Systems  Skin:  Positive for wound (dog bite).     Physical Exam Triage Vital Signs ED Triage Vitals  Encounter Vitals Group     BP 11/30/24 1106 127/83     Girls Systolic BP Percentile --      Girls Diastolic BP Percentile --       Boys Systolic BP Percentile --      Boys Diastolic BP Percentile --      Pulse Rate 11/30/24 1106 67     Resp 11/30/24 1106 17     Temp 11/30/24 1106 98 F (36.7 C)     Temp Source 11/30/24 1106 Oral     SpO2 11/30/24 1106 97 %     Weight --      Height --      Head Circumference --      Peak Flow --      Pain Score 11/30/24 1107 1     Pain Loc --      Pain Education --      Exclude from Growth Chart --    No data found.  Updated Vital Signs BP 127/83 (BP Location: Right Arm)   Pulse 67   Temp 98 F (36.7 C) (Oral)   Resp 17   SpO2 97%   Visual Acuity Right Eye Distance:   Left Eye Distance:   Bilateral Distance:    Right Eye Near:   Left Eye Near:    Bilateral Near:     Physical Exam Vitals reviewed.  Constitutional:      General: He is awake.     Appearance: Normal appearance. He is well-developed and well-groomed.  HENT:     Head: Normocephalic and atraumatic.  Eyes:     Extraocular Movements: Extraocular movements intact.     Conjunctiva/sclera: Conjunctivae normal.  Pulmonary:     Effort: Pulmonary effort is normal.  Musculoskeletal:     Cervical back: Normal range of motion.     Comments: Patient is able to flex, extend, supinate and pronate at the ankle without pain extending up into the calf.   Skin:    General: Skin is warm and dry.      Neurological:     Mental Status: He is alert and oriented to person, place, and time.  Psychiatric:        Attention and Perception: Attention normal.        Mood and Affect: Mood normal.        Speech: Speech normal.        Behavior: Behavior normal. Behavior is cooperative.      UC Treatments / Results  Labs (all labs ordered are listed, but only abnormal results are displayed) Labs Reviewed - No data to display  EKG   Radiology No results found.  Procedures Procedures (including critical care time)  Medications Ordered in UC Medications  Tdap (ADACEL) injection 0.5 mL (0.5 mLs  Intramuscular Given 11/30/24 1208)    Initial Impression / Assessment and Plan / UC Course  I have reviewed the triage vital signs and the nursing notes.  Pertinent labs &  imaging results that were available during my care of the patient were reviewed by me and considered in my medical decision making (see chart for details).      Final Clinical Impressions(s) / UC Diagnoses   Final diagnoses:  Dog bite of left lower leg, initial encounter  Eczema, unspecified type   Dog bite wound to leg Puncture wound to the leg from a dog bite sustained yesterday afternoon. The dog is up to date on vaccinations, including rabies, so rabies is not a concern. The primary concern is potential bacterial infection due to the puncture nature of the wound. - Prescribed antibiotics to prevent infection - Administered tetanus booster - Advised to keep the wound clean and dry with warm water and gentle soap - Instructed to monitor for signs of infection such as redness, swelling, pus, or difficulty moving the leg and foot, and to seek medical attention if these occur  Eczema Chronic eczema with dry skin and raised plaques, present for approximately ten years. Symptoms include itching and are exacerbated by certain foods and weight changes. Current management includes occasional use of dermatologist-prescribed creams. - Prescribed steroid cream to apply to affected areas twice daily for no more than two weeks - Recommended use of thick hydrating ointments like Aquaphor or Vaseline for moisturizing  General Health Maintenance Routine health maintenance discussed, including the importance of tetanus booster following the dog bite. - Administered tetanus booster   Discharge Instructions      VISIT SUMMARY:  You came in today because of a dog bite on your leg that happened yesterday. We also discussed your ongoing eczema condition.  YOUR PLAN:  -DOG BITE WOUND TO LEG: You have a puncture wound on your  leg from a dog bite. Since the dog is vaccinated, rabies is not a concern, but there is a risk of bacterial infection. You have been prescribed antibiotics to prevent infection and received a tetanus booster. Keep the wound clean and dry using warm water and gentle soap. Watch for signs of infection like redness, swelling, pus, or difficulty moving your leg and foot, and seek medical attention if these occur.  -ECZEMA: Eczema is a chronic skin condition that causes dry, itchy, and raised patches on the skin. It can be triggered by certain foods and weight changes. You have been prescribed a steroid cream to apply to the affected areas twice daily for no more than two weeks. Additionally, use thick hydrating ointments like Aquaphor or Vaseline to keep your skin moisturized.  -GENERAL HEALTH MAINTENANCE: We discussed routine health maintenance, including the importance of the tetanus booster you received today following the dog bite.  INSTRUCTIONS:  Please follow up if you notice any signs of infection in the dog bite wound, such as redness, swelling, pus, or difficulty moving your leg and foot. Continue using the prescribed steroid cream for your eczema as directed, and keep your skin moisturized with thick ointments. If your symptoms persist or worsen, schedule a follow-up appointment.     ED Prescriptions     Medication Sig Dispense Auth. Provider   amoxicillin-clavulanate (AUGMENTIN) 875-125 MG tablet Take 1 tablet by mouth every 12 (twelve) hours. 14 tablet Wash Nienhaus E, PA-C   desonide (DESOWEN) 0.05 % cream Apply topically 2 (two) times daily. 30 g Shawndell Varas E, PA-C      PDMP not reviewed this encounter.    [1]  Social History Tobacco Use   Smoking status: Former    Current packs/day: 0.25  Average packs/day: 0.3 packs/day for 15.0 years (3.8 ttl pk-yrs)    Types: Cigarettes   Smokeless tobacco: Never   Tobacco comments:    has bought patches  Vaping Use   Vaping status:  Never Used  Substance Use Topics   Alcohol use: No   Drug use: No     Heliodoro Domagalski, Rocky BRAVO, PA-C 11/30/24 1224

## 2025-04-29 ENCOUNTER — Encounter: Admitting: Family Medicine
